# Patient Record
Sex: Male | Born: 1950 | Race: White | Hispanic: No | Marital: Married | Smoking: Never smoker
Health system: Southern US, Community
[De-identification: ages and names within clinical notes are randomized; demographics above are authoritative.]

## PROBLEM LIST (undated history)

## (undated) DIAGNOSIS — K760 Fatty (change of) liver, not elsewhere classified: Secondary | ICD-10-CM

## (undated) DIAGNOSIS — R7303 Prediabetes: Secondary | ICD-10-CM

## (undated) DIAGNOSIS — E782 Mixed hyperlipidemia: Secondary | ICD-10-CM

## (undated) DIAGNOSIS — I10 Essential (primary) hypertension: Secondary | ICD-10-CM

## (undated) DIAGNOSIS — J302 Other seasonal allergic rhinitis: Secondary | ICD-10-CM

## (undated) DIAGNOSIS — M1990 Unspecified osteoarthritis, unspecified site: Secondary | ICD-10-CM

## (undated) DIAGNOSIS — D126 Benign neoplasm of colon, unspecified: Secondary | ICD-10-CM

## (undated) HISTORY — DX: Other seasonal allergic rhinitis: J30.2

## (undated) HISTORY — PX: TOTAL HIP ARTHROPLASTY: SHX124

## (undated) HISTORY — DX: Prediabetes: R73.03

## (undated) HISTORY — DX: Unspecified osteoarthritis, unspecified site: M19.90

## (undated) HISTORY — DX: Essential (primary) hypertension: I10

## (undated) HISTORY — DX: Mixed hyperlipidemia: E78.2

## (undated) HISTORY — DX: Benign neoplasm of colon, unspecified: D12.6

## (undated) HISTORY — PX: KNEE SURGERY: SHX244

## (undated) HISTORY — PX: ROTATOR CUFF REPAIR: SHX139

## (undated) HISTORY — DX: Fatty (change of) liver, not elsewhere classified: K76.0

---

## 2012-03-11 ENCOUNTER — Ambulatory Visit: Payer: BC Managed Care – PPO | Attending: Diagnostic Neuroimaging | Admitting: Physical Therapy

## 2012-03-11 DIAGNOSIS — IMO0001 Reserved for inherently not codable concepts without codable children: Secondary | ICD-10-CM | POA: Insufficient documentation

## 2012-03-11 DIAGNOSIS — M6281 Muscle weakness (generalized): Secondary | ICD-10-CM | POA: Insufficient documentation

## 2012-03-11 DIAGNOSIS — R269 Unspecified abnormalities of gait and mobility: Secondary | ICD-10-CM | POA: Insufficient documentation

## 2012-03-12 ENCOUNTER — Ambulatory Visit: Payer: BC Managed Care – PPO | Admitting: Physical Therapy

## 2012-03-18 ENCOUNTER — Ambulatory Visit: Payer: BC Managed Care – PPO | Admitting: Physical Therapy

## 2012-03-24 ENCOUNTER — Ambulatory Visit: Payer: BC Managed Care – PPO | Admitting: Physical Therapy

## 2012-04-06 ENCOUNTER — Ambulatory Visit: Payer: BC Managed Care – PPO | Attending: Diagnostic Neuroimaging | Admitting: Physical Therapy

## 2012-04-06 DIAGNOSIS — IMO0001 Reserved for inherently not codable concepts without codable children: Secondary | ICD-10-CM | POA: Insufficient documentation

## 2012-04-06 DIAGNOSIS — M6281 Muscle weakness (generalized): Secondary | ICD-10-CM | POA: Insufficient documentation

## 2012-04-06 DIAGNOSIS — R269 Unspecified abnormalities of gait and mobility: Secondary | ICD-10-CM | POA: Insufficient documentation

## 2012-04-20 ENCOUNTER — Ambulatory Visit: Payer: BC Managed Care – PPO | Admitting: Physical Therapy

## 2012-04-28 ENCOUNTER — Ambulatory Visit: Payer: BC Managed Care – PPO | Admitting: Physical Therapy

## 2012-05-05 ENCOUNTER — Ambulatory Visit: Payer: BC Managed Care – PPO | Admitting: Physical Therapy

## 2012-12-31 ENCOUNTER — Encounter: Payer: Self-pay | Admitting: *Deleted

## 2012-12-31 ENCOUNTER — Encounter: Payer: BC Managed Care – PPO | Attending: Family Medicine | Admitting: *Deleted

## 2012-12-31 DIAGNOSIS — Z713 Dietary counseling and surveillance: Secondary | ICD-10-CM | POA: Insufficient documentation

## 2012-12-31 DIAGNOSIS — R7309 Other abnormal glucose: Secondary | ICD-10-CM | POA: Insufficient documentation

## 2012-12-31 NOTE — Patient Instructions (Addendum)
Plan:  Aim for 3 Carb Choices per meal (45 grams) +/- 1 either way  Consider reading food labels for Total Carbohydrate and Fat Grams of foods Continue with your current activity level as tolerated

## 2012-12-31 NOTE — Progress Notes (Signed)
  Medical Nutrition Therapy:  Appt start time: 0830 end time:  0930.  Assessment:  Primary concerns today: self referred for nutrition and weight management..Lives with wife who also purchases and prepared meals. She enjoys gourmet cooking too. 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 Activity includes workout 5-6 days a week including weights at [facility], fast walking on treadmill at home 2-3 miles at 3 miles per hour, cycling cross couutry as able with weather twice a week as able. Would like to add swimming as an additional source of activity especially with knee replacement. Has followed Renaldo Fiddler diet off and on over last 10 years. Would like guidance to better nutrition to allow for weight loss.   MEDICATIONS: see list   DIETARY INTAKE: Usual eating pattern includes 3 meals and 0 snacks per day.  Everyday foods include good variety of all food groups.  Avoided foods include fried foods and sweets.    24-hr recall:  B ( AM): Adkins nutrition bar and 2 coffee with 1/2 and 1/2 or black OR cereal with 2% milk, banana OR eggs, sausage (no starch) Snk ( AM): none  L ( PM): varies: business lunch to college cafe or goes home; tuna with mayo and relish OR chicken, unsweet tea or diet soda infrequently Snk ( PM): none D ( PM): varies a lot: meat, vegetables, added fruit and cheese Or full course meal if at events Snk ( PM): none Beverages: coffee, unsweet tea, diet soda, some water  Usual physical activity: see above  Estimated energy needs: 1500 calories 170 g carbohydrates 112 g protein 42 g fat  Progress Towards Goal(s):  In progress.   Nutritional Diagnosis:  NI-1.5 Excessive energy intake As related to activity level.  As evidenced by BMI of 33.    Intervention:  Nutrition for weight loss initiated. Discussed Carb Counting and reading food labels as method of portion control. Commended him on his current level of activity.  Handouts  given during visit include: Carb Counting and Food Label handouts Meal Plan Card  Monitoring/Evaluation:  Dietary intake, exercise, reading food labels, and body weight prn.

## 2013-01-09 ENCOUNTER — Encounter: Payer: Self-pay | Admitting: *Deleted

## 2013-05-21 ENCOUNTER — Other Ambulatory Visit: Payer: Self-pay | Admitting: Family Medicine

## 2013-05-21 DIAGNOSIS — R7989 Other specified abnormal findings of blood chemistry: Secondary | ICD-10-CM

## 2013-05-27 ENCOUNTER — Ambulatory Visit
Admission: RE | Admit: 2013-05-27 | Discharge: 2013-05-27 | Disposition: A | Discharge status: Home / Self Care | Payer: BC Managed Care – PPO | Source: Ambulatory Visit | Attending: Family Medicine | Admitting: Family Medicine

## 2013-05-27 DIAGNOSIS — R7989 Other specified abnormal findings of blood chemistry: Secondary | ICD-10-CM

## 2013-08-13 ENCOUNTER — Emergency Department (HOSPITAL_COMMUNITY)
Admission: EM | Admit: 2013-08-13 | Discharge: 2013-08-13 | Disposition: A | Discharge status: Home / Self Care | Payer: BC Managed Care – PPO | Attending: Emergency Medicine | Admitting: Emergency Medicine

## 2013-08-13 ENCOUNTER — Encounter (HOSPITAL_COMMUNITY): Payer: Self-pay | Admitting: Emergency Medicine

## 2013-08-13 DIAGNOSIS — T7840XA Allergy, unspecified, initial encounter: Secondary | ICD-10-CM | POA: Insufficient documentation

## 2013-08-13 DIAGNOSIS — R220 Localized swelling, mass and lump, head: Secondary | ICD-10-CM | POA: Insufficient documentation

## 2013-08-13 DIAGNOSIS — L299 Pruritus, unspecified: Secondary | ICD-10-CM | POA: Insufficient documentation

## 2013-08-13 DIAGNOSIS — Y990 Civilian activity done for income or pay: Secondary | ICD-10-CM | POA: Insufficient documentation

## 2013-08-13 DIAGNOSIS — J309 Allergic rhinitis, unspecified: Secondary | ICD-10-CM | POA: Insufficient documentation

## 2013-08-13 DIAGNOSIS — Y929 Unspecified place or not applicable: Secondary | ICD-10-CM | POA: Insufficient documentation

## 2013-08-13 DIAGNOSIS — Z79899 Other long term (current) drug therapy: Secondary | ICD-10-CM | POA: Insufficient documentation

## 2013-08-13 DIAGNOSIS — R221 Localized swelling, mass and lump, neck: Secondary | ICD-10-CM | POA: Insufficient documentation

## 2013-08-13 DIAGNOSIS — T6591XA Toxic effect of unspecified substance, accidental (unintentional), initial encounter: Secondary | ICD-10-CM | POA: Insufficient documentation

## 2013-08-13 DIAGNOSIS — Z880 Allergy status to penicillin: Secondary | ICD-10-CM | POA: Insufficient documentation

## 2013-08-13 DIAGNOSIS — E669 Obesity, unspecified: Secondary | ICD-10-CM | POA: Insufficient documentation

## 2013-08-13 DIAGNOSIS — I10 Essential (primary) hypertension: Secondary | ICD-10-CM | POA: Insufficient documentation

## 2013-08-13 DIAGNOSIS — Z7982 Long term (current) use of aspirin: Secondary | ICD-10-CM | POA: Insufficient documentation

## 2013-08-13 DIAGNOSIS — R0789 Other chest pain: Secondary | ICD-10-CM | POA: Insufficient documentation

## 2013-08-13 DIAGNOSIS — R0602 Shortness of breath: Secondary | ICD-10-CM | POA: Insufficient documentation

## 2013-08-13 MED ORDER — PREDNISONE 20 MG PO TABS
60.0000 mg | ORAL_TABLET | Freq: Once | ORAL | Status: AC
  Administered 2013-08-13: 60 mg via ORAL
  Filled 2013-08-13: qty 3

## 2013-08-13 MED ORDER — FAMOTIDINE 20 MG PO TABS
20.0000 mg | ORAL_TABLET | Freq: Once | ORAL | Status: AC
  Administered 2013-08-13: 20 mg via ORAL
  Filled 2013-08-13: qty 1

## 2013-08-13 MED ORDER — DIPHENHYDRAMINE HCL 25 MG PO CAPS
50.0000 mg | ORAL_CAPSULE | Freq: Once | ORAL | Status: AC
  Administered 2013-08-13: 50 mg via ORAL
  Filled 2013-08-13: qty 2

## 2013-08-13 MED ORDER — PREDNISONE 20 MG PO TABS: ORAL_TABLET | ORAL | Status: DC

## 2013-08-13 MED ORDER — EPINEPHRINE 0.3 MG/0.3ML IJ SOAJ: 0.3000 mg | Freq: Once | INTRAMUSCULAR | Status: DC

## 2013-08-13 NOTE — ED Provider Notes (Signed)
I have personally seen and examined the patient.  I have discussed the plan of care with the resident.  I have reviewed the documentation on PMH/FH/Soc. History.  I have reviewed the documentation of the resident and agree.  I have reviewed and agree with the ECG interpretation(s) documented by the resident.   Joya Gaskins, MD 08/13/13 [phone]

## 2013-08-13 NOTE — ED Provider Notes (Signed)
CSN: [id number]     Arrival date & time 08/13/13  1636 History   First MD Initiated Contact with Patient 08/13/13 1651     Chief Complaint  Patient presents with  . Allergic Reaction   (Consider location/radiation/quality/duration/timing/severity/associated sxs/prior Treatment) HPI [name] is a 62 y.o. y/o male w/ PMHx of HTN, presents to the ED w/ complaints of recent itching, facial swelling, and chest tightness. The patient claims he was at work and started to feel itching on the top of his head w/ an accompanying bump, then his right lower jaw. He then had swelling of his left eye w/ redness to the point that his co-workers began to notice. He also had swelling of his lips and pruritis on his abdomen. The patient denies eating any new or different foods, no recent changes in detergents, soaps, insect bites, or plant exposure. He does have a history of severe hay fever as a child and has carried an epi-pen in the past for wasp bites.    Past Medical History  Diagnosis Date  . Hypertension   . Obesity    Past Surgical History  Procedure Laterality Date  . Joint replacement     History reviewed. No pertinent family history. History  Substance Use Topics  . Smoking status: Never Smoker   . Smokeless tobacco: Never Used  . Alcohol Use: Yes     Comment: wine with dinner 4-5 nights a week    Review of Systems General: Denies fever, chills, diaphoresis, appetite change and fatigue.  HEENT: Pruritis of head, right jaw, swelling of left eye, and swelling of lips. Respiratory: Positive for mild SOB, chest tightness. Denies cough and wheezing.   Cardiovascular: Denies chest pain, palpitations and leg swelling.  Gastrointestinal: Denies nausea, vomiting, abdominal pain, diarrhea, constipation, blood in stool and abdominal distention.  Genitourinary: Denies dysuria, urgency, frequency, hematuria, flank pain and difficulty urinating.  Endocrine: Denies hot or cold intolerance,  sweats, polyuria, polydipsia. Musculoskeletal: Denies myalgias, back pain, joint swelling, arthralgias and gait problem.  Skin: Denies pallor, rash and wounds.  Neurological: Denies dizziness, seizures, syncope, weakness, lightheadedness, numbness and headaches.  Psychiatric/Behavioral: Denies mood changes, confusion, nervousness, sleep disturbance and agitation.  Allergies  Penicillins  Home Medications   Current Outpatient Rx  Name  Route  Sig  Dispense  Refill  . aspirin 81 MG tablet   Oral   Take 81 mg by mouth daily.         . fish oil-omega-3 fatty acids 1000 MG capsule   Oral   Take 2 g by mouth daily.         . Ginkgo Biloba 40 MG TABS   Oral   Take by mouth.         . metoprolol succinate (TOPROL-XL) 100 MG 24 hr tablet   Oral   Take 100 mg by mouth daily. Take with or immediately following a meal.         . Multiple Vitamin (MULTIVITAMIN) tablet   Oral   Take 1 tablet by mouth daily.         . naproxen sodium (ANAPROX) 220 MG tablet   Oral   Take 220 mg by mouth 2 (two) times daily with a meal.         . triamterene-hydrochlorothiazide (DYAZIDE) 37.5-25 MG per capsule   Oral   Take 1 capsule by mouth every morning.          Physical Exam Filed Vitals:   08/13/13 1647  BP: 156/93  Pulse: 87  Temp: 98.3 F (36.8 C)  TempSrc: Oral  Resp: 18  SpO2: 100%   General: Vital signs reviewed.  Patient is a well-developed and well-nourished, in no acute distress and cooperative with exam. Alert and oriented x3.  Head: Normocephalic and atraumatic. No obvious urticaria or erythema Eyes: PERRL, EOMI, conjunctivae normal, No scleral icterus. Left eye swelling. Neck: Supple, trachea midline, normal ROM, No JVD, masses, thyromegaly, or carotid bruit present.  Cardiovascular: RRR, S1 normal, S2 normal, no murmurs, gallops, or rubs. Pulmonary/Chest: Normal respiratory effort, CTAB, no wheezes, rales, or rhonchi. Abdominal: Soft, non-tender,  non-distended, bowel sounds are normal, no masses, organomegaly, or guarding present. Small patch of erythema inferior to umbilicus. Musculoskeletal: No joint deformities, erythema, or stiffness, ROM full and no nontender. Extremities: No swelling or edema,  pulses symmetric and intact bilaterally. No cyanosis or clubbing. Neurological: A&O x3, Strength is normal and symmetric bilaterally, cranial nerve II-XII are grossly intact, no focal motor deficit, sensory intact to light touch bilaterally.  Skin: Warm, dry and intact. No rashes or erythema. Psychiatric: Normal mood and affect. speech and behavior is normal. Cognition and memory are normal.   ED Course  Procedures (including critical care time) Labs Review Labs Reviewed - No data to display Imaging Review No results found.  EKG Interpretation    Date/Time:  Friday August 13 2013 16:40:53 EST Ventricular Rate:  88 PR Interval:  150 QRS Duration: 98 QT Interval:  388 QTC Calculation: 469 R Axis:   -4 Text Interpretation:  Normal sinus rhythm Normal ECG Confirmed by Bebe Shaggy  MD, DONALD [phone]) on 08/13/2013 4:52:43 PM            MDM   [name] is a 62 y.o. y/o male w/ PMHx of HTN, presents to the ED w/ complaints of recent itching, facial swelling, and chest tightness, likely 2/2 allergic reaction. Given clinical presentation, do not feel Epinephrine is necessary at this time.  -Will observe for now. -Benadryl 50 mg po -Pepcid 20 mg po -Prednisone 60 mg po  Patient much improved, still with mild pruritis, lip swelling improved according to the patient. No further intervention necessary at this time, patient is stable for discharge. Will give 3 more days of Prednisone 60 mg po qd + Rx for Epi-pen. Will instruct to take Benadryl OTC.  Courtney Paris, MD 08/13/13 [phone]

## 2013-08-13 NOTE — ED Provider Notes (Signed)
Patient seen/examined in the Emergency Department in conjunction with Resident Physician Provider Jones Patient reports allergic rxn Exam : mild swelling to left eye.  No lip/tongue swelling noted.  Uvula midline without erythema/edema Plan: treat for allergic rxn and reassess Would offer epipen as Rx at discharge   Joya Gaskins, MD 08/13/13 1750

## 2013-08-13 NOTE — ED Notes (Signed)
Pt c/o allergic reaction today from unknown cause with itching to head with bumps, left eye swelling and lip swelling; pt sts some tightness in chest; no distress noted

## 2013-08-13 NOTE — ED Notes (Signed)
EKG completed in triage.

## 2014-03-11 ENCOUNTER — Encounter: Payer: Self-pay | Admitting: *Deleted

## 2014-10-09 IMAGING — US US ABDOMEN COMPLETE
1 series · 14 of 25 positions shown · non-contrast
Comparison: None.

CLINICAL DATA: Elevated LFTs

EXAM:
ABDOMEN ULTRASOUND

[Series 1: us abdomen complete · 0.49mm/px · 14 of 90 slices shown]
[im 1/90]
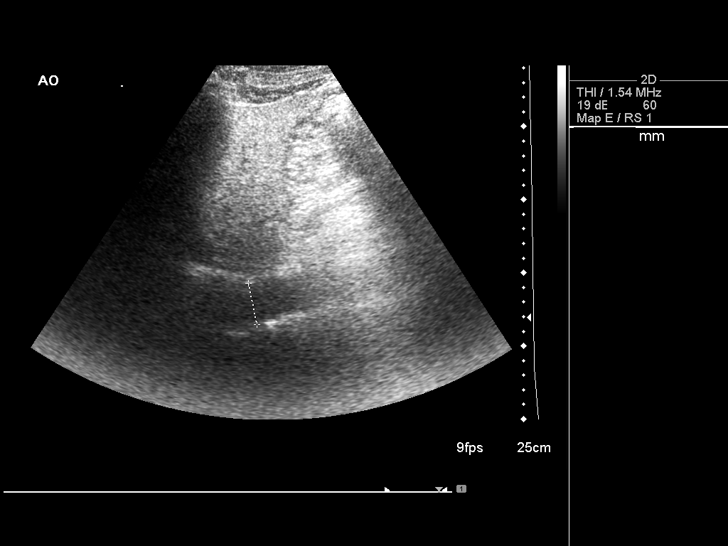
[im 8/90]
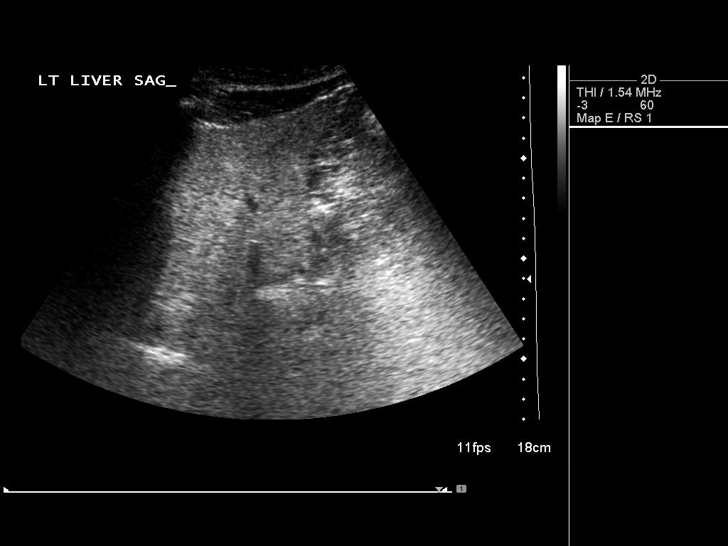
[im 15/90]
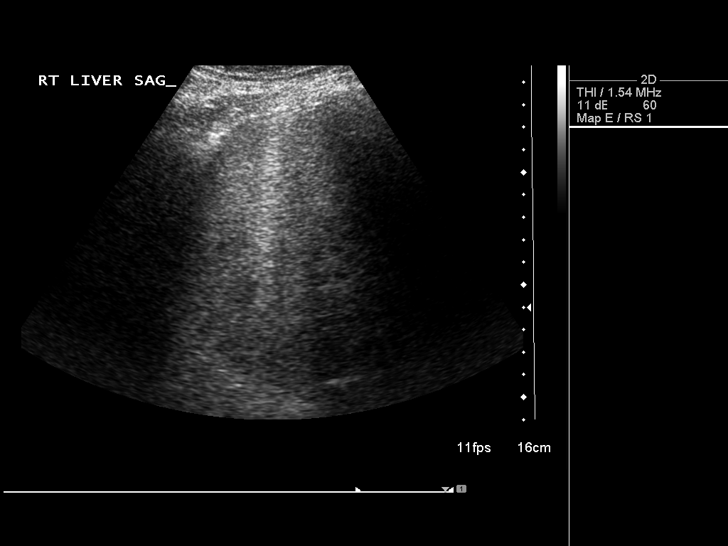
[im 23/90]
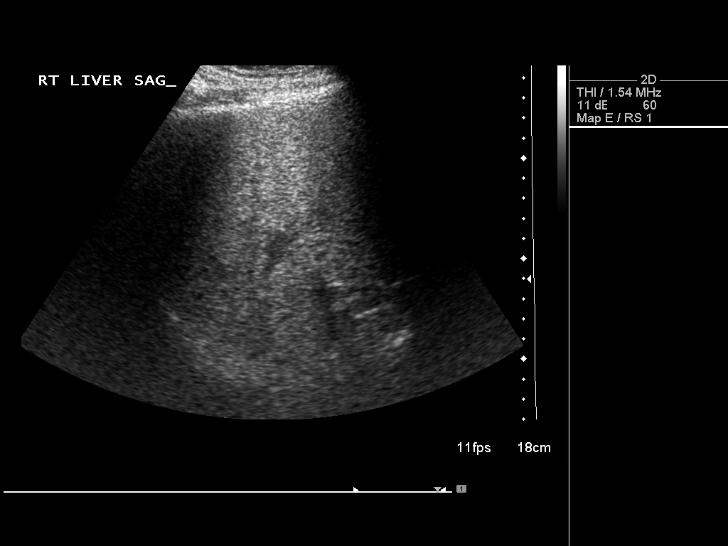
[im 30/90]
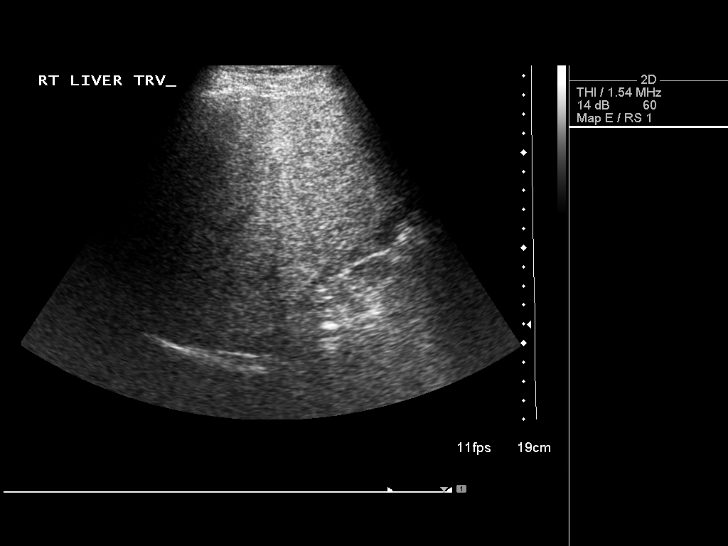
[im 34/90]
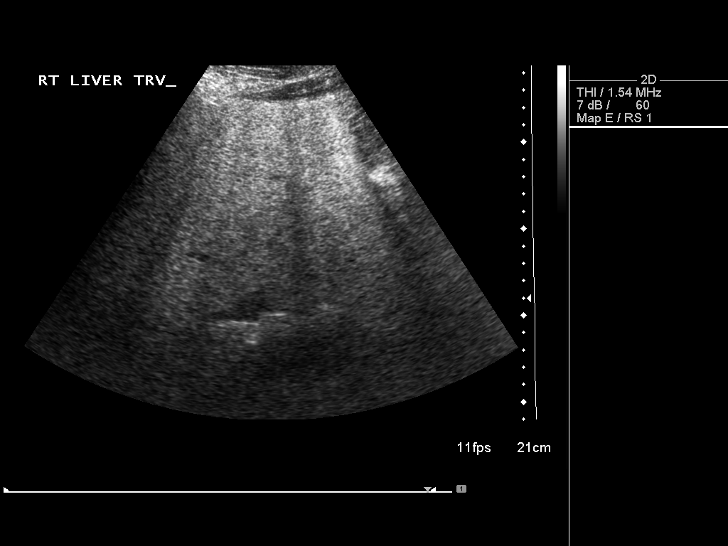
[im 41/90]
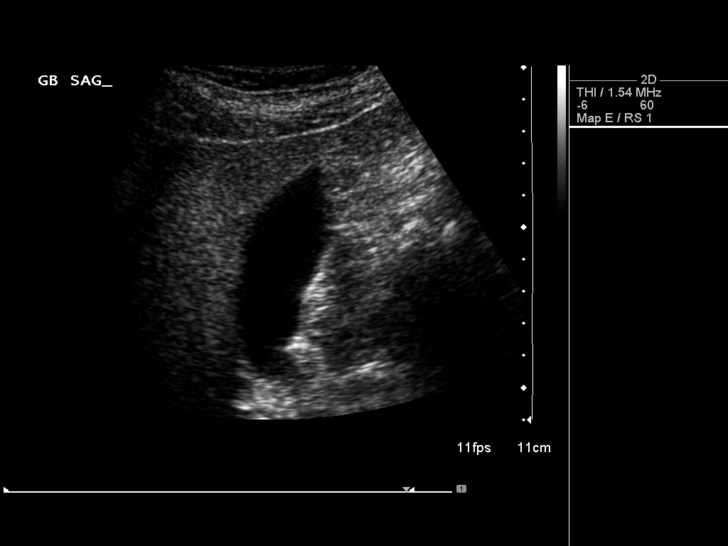
[im 49/90]
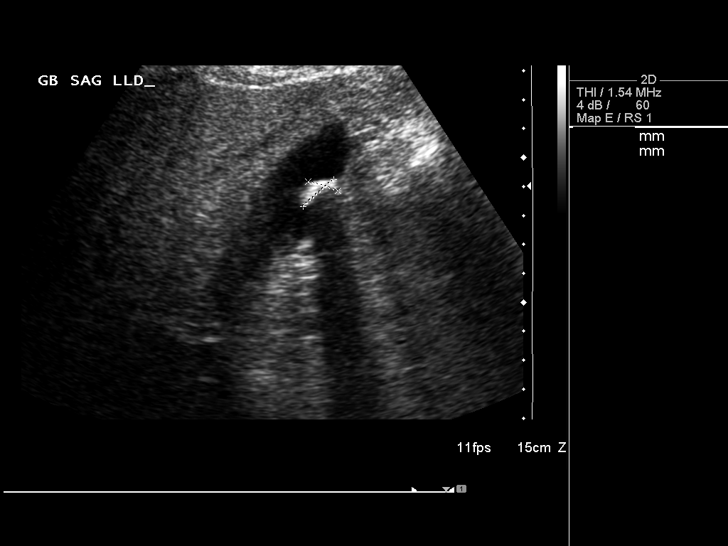
[im 56/90]
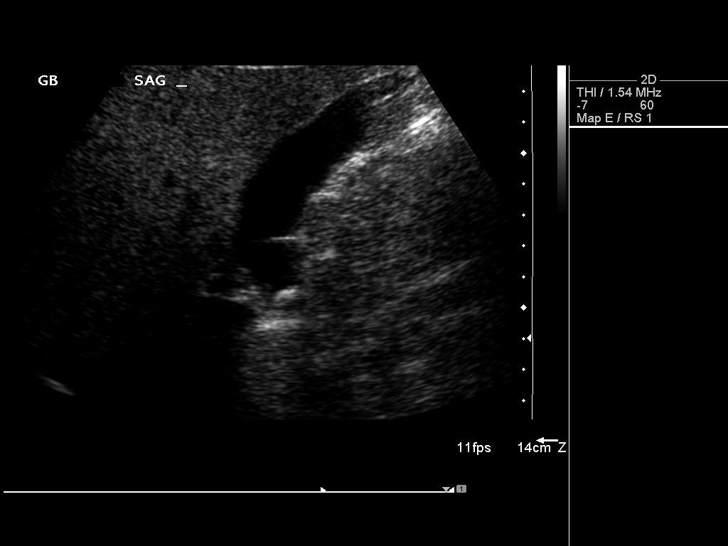
[im 60/90]
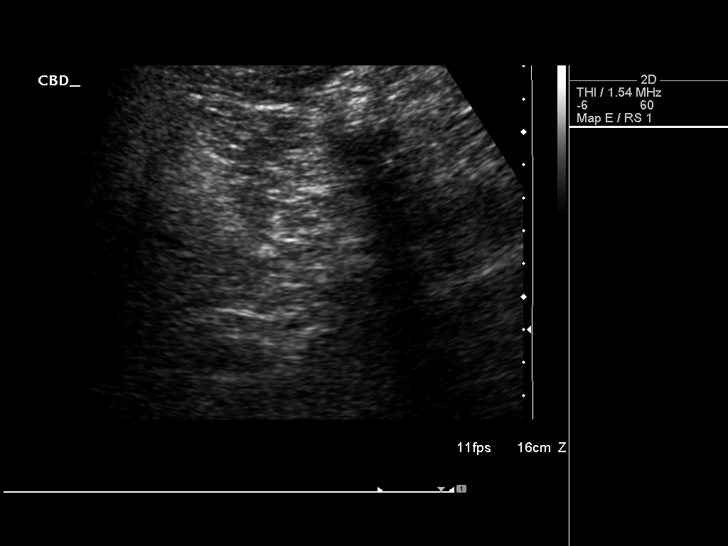
[im 67/90]
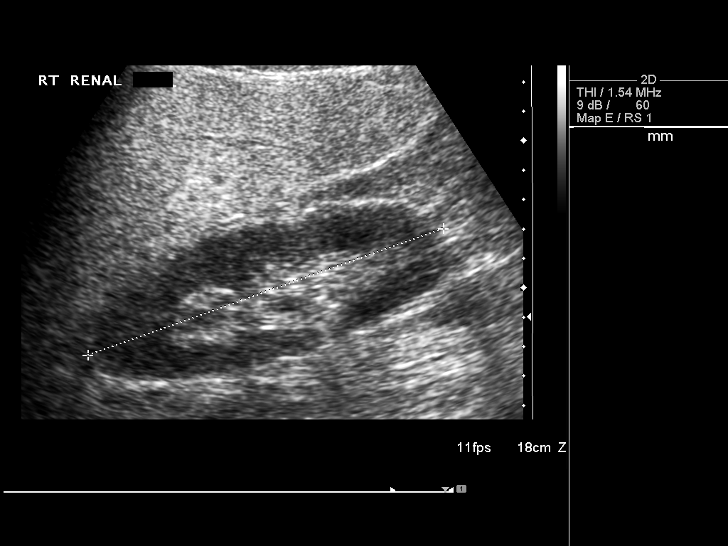
[im 75/90]
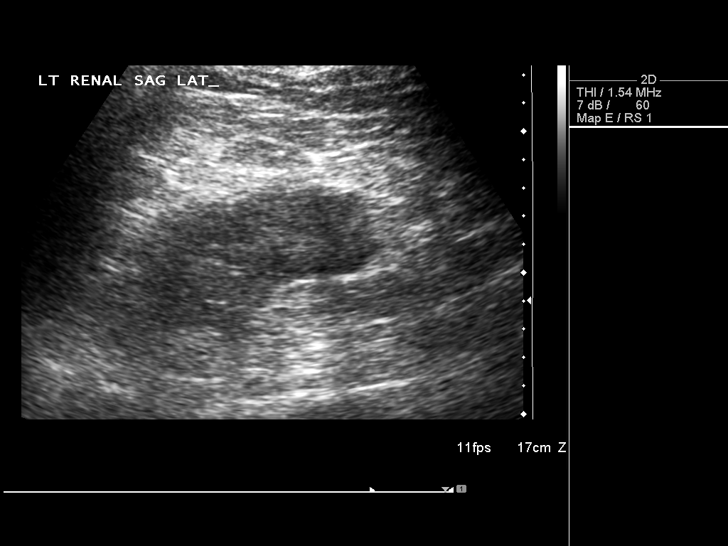
[im 82/90]
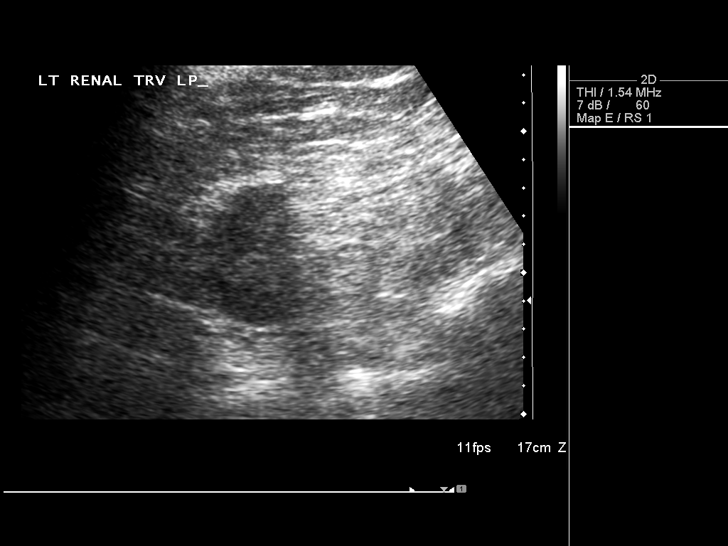
[im 90/90]
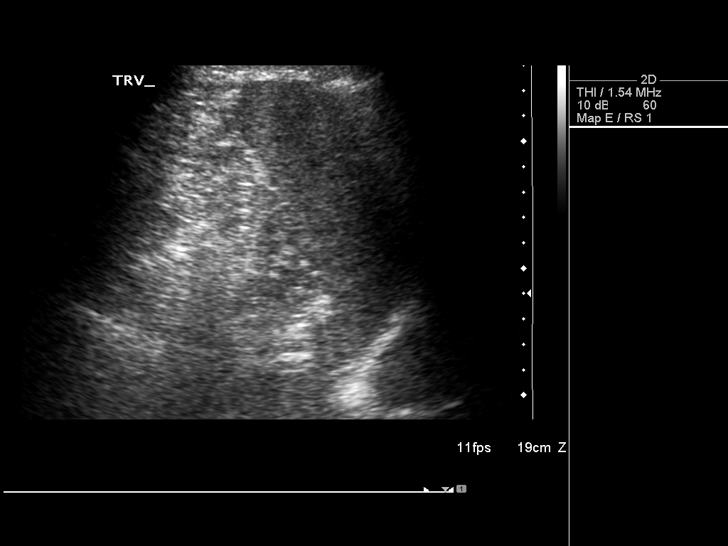

[14 of 25 positions shown; findings below may reference images not displayed]

FINDINGS: Gallbladder

14 mm mobile gallstone. No gallbladder wall thickening or
pericholecystic fluid. Negative sonographic Murphy's sign.

Common bile duct

Diameter: 5 mm.

Liver

No focal lesion identified. Hyperechoic hepatic parenchyma,
reflecting hepatic steatosis.

IVC

Poorly visualized.

Pancreas

Poorly visualized due to overlying bowel gas.

Spleen

Measures 6.9 cm.

Right Kidney

Length: 14.3 cm. Echogenicity within normal limits. No mass or
hydronephrosis visualized.

Left Kidney

Length: 12.9 cm. Echogenicity within normal limits. No mass or
hydronephrosis visualized.

Abdominal aorta

No aneurysm visualized.
IMPRESSION: Cholelithiasis, without associated sonographic findings to suggest
acute cholecystitis.

Hepatic steatosis.

## 2015-12-05 DIAGNOSIS — M25561 Pain in right knee: Secondary | ICD-10-CM | POA: Diagnosis not present

## 2015-12-05 DIAGNOSIS — Z23 Encounter for immunization: Secondary | ICD-10-CM | POA: Diagnosis not present

## 2015-12-13 DIAGNOSIS — M25561 Pain in right knee: Secondary | ICD-10-CM | POA: Diagnosis not present

## 2015-12-21 DIAGNOSIS — M25561 Pain in right knee: Secondary | ICD-10-CM | POA: Diagnosis not present

## 2016-01-02 DIAGNOSIS — M25561 Pain in right knee: Secondary | ICD-10-CM | POA: Diagnosis not present

## 2016-02-07 DIAGNOSIS — M25561 Pain in right knee: Secondary | ICD-10-CM | POA: Diagnosis not present

## 2016-02-12 DIAGNOSIS — K760 Fatty (change of) liver, not elsewhere classified: Secondary | ICD-10-CM | POA: Diagnosis not present

## 2016-02-12 DIAGNOSIS — I10 Essential (primary) hypertension: Secondary | ICD-10-CM | POA: Diagnosis not present

## 2016-02-12 DIAGNOSIS — E7800 Pure hypercholesterolemia, unspecified: Secondary | ICD-10-CM | POA: Diagnosis not present

## 2016-02-12 DIAGNOSIS — R7303 Prediabetes: Secondary | ICD-10-CM | POA: Diagnosis not present

## 2016-02-13 ENCOUNTER — Ambulatory Visit (INDEPENDENT_AMBULATORY_CARE_PROVIDER_SITE_OTHER): Payer: BLUE CROSS/BLUE SHIELD | Admitting: Diagnostic Neuroimaging

## 2016-02-13 ENCOUNTER — Encounter: Payer: Self-pay | Admitting: Diagnostic Neuroimaging

## 2016-02-13 VITALS — BP 133/84 | HR 86 | Ht 66.0 in | Wt 186.0 lb

## 2016-02-13 DIAGNOSIS — G5631 Lesion of radial nerve, right upper limb: Secondary | ICD-10-CM | POA: Diagnosis not present

## 2016-02-13 NOTE — Progress Notes (Signed)
[facility]  PATIENT: Shawn Ruiz DOB: [birth year]  REFERRING CLINICIAN: Ehinger HISTORY FROM: patient  REASON FOR VISIT: new consult / existing patient    HISTORICAL  CHIEF COMPLAINT:  Chief Complaint  Patient presents with  . 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 He was lifting and carrying, chopping down brush, as well as moving boxes at home. After 4 days of heavy exertion he noticed weakness in his right grip strength in right arm weakness. Numbness in right hand was noted. He was developing right wrist drop. 12 pulling open a door his finger slipped and he banged his right elbow against a hard object. In retrospect 15 years ago patient had an episode of left hand weakness associated with heavy exertion. No family history of similar problems. Patient has been using a brace on his right hand and elbow. Symptoms are slightly improving.  PRIOR HPI (02/27/12): 65 year old male with HTN, hyperchol, with left foot drop. 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 Felt tired in PM, fell asleep at table, but woke up and slept rest of night in bed. No trauma. June 22 - woke up with left foot weakness; went to local hospital and had workup. dx'd with left foot drop. Since then, slight improvement in symptoms.still with numbness and weakness. no problems in right foot, hands, or neck. no HA. no feveres, chills, bowel/bladder problems. no back pain. 4-5 days prior to symptom onset, discovered "bug bite" mark on left knee. No tick found. Treated with doxycyclinge. Serum lyme test  negative.   REVIEW OF SYSTEMS: Full 14 system review of systems performed and negative with exception of: Numbness weakness joint swelling neck pain.   ALLERGIES: Allergies  Allergen Reactions  . Penicillins     Childhood, reaction unknown    HOME MEDICATIONS: Outpatient Prescriptions Prior to Visit  Medication Sig Dispense Refill  . Multiple Vitamin (MULTIVITAMIN) tablet Take 1 tablet by mouth daily.    Marland Kitchen triamterene-hydrochlorothiazide (DYAZIDE) 37.5-25 MG per capsule Take 1 capsule by mouth every morning.    Marland Kitchen EPINEPHrine (EPI-PEN) 0.3 mg/0.3 mL SOAJ injection Inject 0.3 mLs (0.3 mg total) into the muscle once. (Patient not taking: Reported on 02/13/2016) 2 Device 0  . aspirin 81 MG tablet Take 81 mg by mouth daily.    . fish oil-omega-3 fatty acids 1000 MG capsule Take 2 g by mouth daily.    . Ginkgo Biloba 40 MG TABS Take 1 tablet by mouth daily.     . metoprolol succinate (TOPROL-XL) 100 MG 24 hr tablet Take 100 mg by mouth daily. Take with or immediately following a meal.    . naproxen sodium (ANAPROX) 220 MG tablet Take 220 mg by mouth 2 (two) times daily with a meal.    . predniSONE (DELTASONE) 20 MG tablet Please take 60 mg (3 tablets) daily for 3 more days. 9 tablet 0   No facility-administered medications prior to visit.    PAST MEDICAL HISTORY: Past Medical History  Diagnosis Date  . Essential hypertension, benign   .  Mixed hyperlipidemia   . Prediabetes   . OA (osteoarthritis)   . Seasonal allergies   . Adenomatous colon polyp   . Fatty liver     PAST SURGICAL HISTORY: Past Surgical History  Procedure Laterality Date  . Total hip arthroplasty Right   . Rotator cuff repair Right   . Knee surgery Left     torn meniscus    FAMILY HISTORY: Family History  Problem Relation Age of Onset  . CAD Father   . Ulcers Mother   . Hypertension Mother   . CAD Mother   . Congestive Heart Failure Mother     SOCIAL HISTORY:  Social History   Social History    . Marital Status: Married    Spouse Name: carolyn  . Number of Children: 4  . Years of Education: PhD   Occupational History  .      [employer]   Social History Main Topics  . Smoking status: Never Smoker   . Smokeless tobacco: Never Used  . Alcohol Use: Yes     Comment: wine with dinner 4-5 nights a week  . Drug Use: No  . Sexual Activity: Not on file   Other Topics Concern  . Not on file   Social History Narrative   Lives with wife     PHYSICAL EXAM  GENERAL EXAM/CONSTITUTIONAL: Vitals:  Filed Vitals:   02/13/16 1118  BP: 133/84  Pulse: 86  Height: 5\' 6"  (1.676 m)  Weight: 186 lb (84.369 kg)     Body mass index is 30.04 kg/(m^2).  Visual Acuity Screening   Right eye Left eye Both eyes  Without correction:     With correction: 20/20 20/20      Patient is in no distress; well developed, nourished and groomed; neck is supple  CARDIOVASCULAR:  Examination of carotid arteries is normal; no carotid bruits  Regular rate and rhythm, no murmurs  Examination of peripheral vascular system by observation and palpation is normal  EYES:  Ophthalmoscopic exam of optic discs and posterior segments is normal; no papilledema or hemorrhages  MUSCULOSKELETAL:  Gait, strength, tone, movements noted in Neurologic exam below  NEUROLOGIC: MENTAL STATUS:  No flowsheet data found.  awake, alert, oriented to person, place and time  recent and remote memory intact  normal attention and concentration  language fluent, comprehension intact, naming intact,   fund of knowledge appropriate  CRANIAL NERVE:   2nd - no papilledema on fundoscopic exam  2nd, 3rd, 4th, 6th - pupils equal and reactive to light, visual fields full to confrontation, extraocular muscles intact, no nystagmus  5th - facial sensation symmetric  7th - facial strength symmetric  8th - hearing intact  9th - palate elevates symmetrically, uvula midline  11th - shoulder shrug  symmetric  12th - tongue protrusion midline  MOTOR:   normal bulk and tone, full strength in the LUE, BLE  RUE (TRICEPS 3, BRACHIORAD 4, SUPINATOR 3, WRIST EXT 2-3, FINGER EXT 2; GRIP 4; FINGER ABDUCTION 4)  SENSORY:   normal and symmetric to light touch, pinprick, temperature, vibration; EXCEPT DECR IN RIGHT FOREARM AND HAND IN RADIAL NERVE DISTRIBUTION  COORDINATION:   finger-nose-finger, fine finger movements normal  REFLEXES:   deep tendon reflexes present and symmetric  GAIT/STATION:   narrow based gait; able to walk on toes, heels and tandem; romberg is negative    DIAGNOSTIC DATA (LABS, IMAGING, TESTING) - I reviewed patient records, labs, notes, testing and imaging myself where available.  No results found for: WBC, HGB, HCT, MCV, PLT No results found for: NA, K, CL, CO2, GLUCOSE, BUN, CREATININE, CALCIUM, PROT, ALBUMIN, AST, ALT, ALKPHOS, BILITOT, GFRNONAA, GFRAA No results found for: CHOL, HDL, LDLCALC, LDLDIRECT, TRIG, CHOLHDL No results found for: HGBA1C No results found for: VITAMINB12 No results found for: TSH   03/11/12 EMG/NCS  1. 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 2. No electrodiagnostic evidence of left L5 radiculopathy or underlying polyneuropathy.    ASSESSMENT AND PLAN  65 y.o. year old male here with new onset right upper extremity weakness in setting of heavy exertion. Symptoms localized mainly to right radial neuropathy. Interestingly patient had similar episode 4 years ago with left foot drop in setting of heavy exertion. May have had a similar episode affecting left hand 15 years ago. Therefore patient may have some predisposition for compression neuropathy such as the genetic condition hereditary neuropathy with liability for pressure palsies (HNPP).  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 Compression neuropathy vs  HNPP  1. Radial neuropathy, right      PLAN: - symptoms are spontenously improving, therefore will refer for occupational therapy - avoid pressure compression - if symptoms do not improve, then may consider EMG/NCS and MRI cervical spine  Orders Placed This Encounter  Procedures  . Ambulatory referral to Occupational Therapy   Return in about 2 months (around 04/14/2016).    Penni Bombard, MD [id number], [id number] AM Certified in Neurology, Neurophysiology and Neuroimaging  [facility] [address], [place], [place] [address] [phone]

## 2016-02-13 NOTE — Patient Instructions (Signed)
Thank you for coming to see [place] at [facility]. I hope we have been able to provide you high quality care today.  You may receive a patient satisfaction survey over the next few weeks. We would appreciate your feedback and comments so that we may continue to improve ourselves and the health of our patients.  - I will setup occupational therapy.  - Avoid excessive pressure / exertion on joints / limbs.   ~~~~~~~~~~~~~~~~~~~~~~~~~~~~~~~~~~~~~~~~~~~~~~~~~~~~~~~~~~~~~~~~~  DR. Holdan Stucke'S GUIDE TO HAPPY AND HEALTHY LIVING These are some of my general health and wellness recommendations. Some of them may apply to you better than others. Please use common sense as you try these suggestions and feel free to ask me any questions.   ACTIVITY/FITNESS Mental, social, emotional and physical stimulation are very important for brain and body health. Try learning a new activity (arts, music, language, sports, games).  Keep moving your body to the best of your abilities. You can do this at home, inside or outside, the park, community center, gym or anywhere you like. Consider a physical therapist or personal trainer to get started. Consider the app Sworkit. Fitness trackers such as smart-watches, smart-phones or Fitbits can help as well.   NUTRITION Eat more plants: colorful vegetables, nuts, seeds and berries.  Eat less sugar, salt, preservatives and processed foods.  Avoid toxins such as cigarettes and alcohol.  Drink water when you are thirsty. Warm water with a slice of lemon is an excellent morning drink to start the day.  Consider these websites for more information The Nutrition Source (https://www.henry-hernandez.biz/) Precision Nutrition (WindowBlog.ch)   RELAXATION Consider practicing mindfulness meditation or other relaxation techniques such as deep breathing, prayer, yoga, tai chi, massage. See website mindful.org or the  apps Headspace or Calm to help get started.   SLEEP Try to get at least 7-8+ hours sleep per day. Regular exercise and reduced caffeine will help you sleep better. Practice good sleep hygeine techniques. See website sleep.org for more information.   PLANNING Prepare estate planning, living will, healthcare POA documents. Sometimes this is best planned with the help of an attorney. Theconversationproject.org and agingwithdignity.org are excellent resources.

## 2016-02-15 ENCOUNTER — Ambulatory Visit: Payer: BLUE CROSS/BLUE SHIELD | Attending: Diagnostic Neuroimaging | Admitting: Occupational Therapy

## 2016-02-15 ENCOUNTER — Encounter: Payer: Self-pay | Admitting: Occupational Therapy

## 2016-02-15 DIAGNOSIS — M25631 Stiffness of right wrist, not elsewhere classified: Secondary | ICD-10-CM | POA: Insufficient documentation

## 2016-02-15 DIAGNOSIS — M6281 Muscle weakness (generalized): Secondary | ICD-10-CM | POA: Insufficient documentation

## 2016-02-15 DIAGNOSIS — R208 Other disturbances of skin sensation: Secondary | ICD-10-CM | POA: Diagnosis not present

## 2016-02-15 DIAGNOSIS — M25521 Pain in right elbow: Secondary | ICD-10-CM | POA: Diagnosis not present

## 2016-02-15 NOTE — Therapy (Signed)
[facility] [address] [place], [place], [address] Phone: [phone]   Fax:  [phone]  Occupational Therapy Evaluation  Patient Details  Name: Shawn Ruiz MRN: [id number] Date of Birth: Jul 20, 1951 Referring Provider: Dr. Curt Bears  Encounter Date: 02/15/2016      OT End of Session - 02/15/16 1517    Visit Number 1   Number of Visits 16   Date for OT Re-Evaluation 04/11/16   Authorization Type BCBS   Authorization Time Period awaiting info regarding possible insurance limitations   OT Start Time 1401   OT Stop Time 1442   OT Time Calculation (min) 41 min   Activity Tolerance Patient tolerated treatment well      Past Medical History  Diagnosis Date  . Essential hypertension, benign   . Mixed hyperlipidemia   . Prediabetes   . OA (osteoarthritis)   . Seasonal allergies   . Adenomatous colon polyp   . Fatty liver     Past Surgical History  Procedure Laterality Date  . Total hip arthroplasty Right   . Rotator cuff repair Right   . Knee surgery Left     torn meniscus    There were no vitals filed for this visit.      Subjective Assessment - 02/15/16 1407    Subjective  This has happened before to me - I guess I need to watch how much I am doing.   Pertinent History see epic snapshot.    Patient Stated Goals I want to regain full function in my right hand.    Currently in Pain? Yes   Pain Score 4    Pain Location Elbow   Pain Orientation Right   Pain Descriptors / Indicators Constant;Aching;Dull   Pain Type Acute pain   Pain Onset 1 to 4 weeks ago   Aggravating Factors  If I sit with elbow resting on something or work above my head   Pain Relieving Factors avoiding above, lidocaine rub. Pt banged eblow very hard on marble counte open a drawer. Reports it is improving every day           [facility] OT Assessment - 02/15/16 0001    Assessment   Diagnosis R radial neuropathy   Referring  Provider Dr. Curt Bears   Onset Date 02/02/16   Prior Therapy No prior therapy for this current episode   Precautions   Precautions Other (comment)   Required Braces or Orthoses Other Brace/Splint   Other Brace/Splint elbow pad and wrist splint. 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   ADL   Eating/Feeding Modified independent  using L non dominant hand   Grooming Modified independent  Upper Body Bathing Modified independent   Lower Body Bathing Modified independent   Upper Body Dressing Minimal assistance  for top button and to put on tie   Lower Body Dressing Modified independent   Toilet Tranfer Modified independent   Toileting - Clothing Manipulation Modified independent   [place] Transfer Independent   ADL comments Pt using L non dominant hand as dominant - only able to use R hand in very limited fashion for some activities.   IADL   Shopping Takes care of all shopping needs independently   [place] alone or with occasional assistance   Meal Prep --  pt's wife did all cooking  before   Medication Management Is responsible for taking medication in correct dosages at correct time   modified how he is opening containers   Financial Management Manages financial matters independently (budgets, writes checks, pays rent, bills goes to bank), collects and keeps track of income   Mobility   Mobility Status Independent   Written Expression   Dominant Hand Right   Handwriting 50% legible  pt reports he can write slightly better with wrist brace   Vision - History   Baseline Vision Wears glasses all the time   Vision Assessment   Eye Alignment Within Functional Limits   Comment No visual deficits   Activity Tolerance   Activity Tolerance Endurance does not limit participation in activity   Cognition   Overall Cognitive Status Within Functional Limits for tasks assessed   Sensation   Light Touch Impaired by gross assessment  dorsal aspect of thumb, index and middle finger   Hot/Cold Impaired by gross assessment  dorsal aspect of thumb, index and middle finger   Additional Comments Pt reports slow improvement in numbness, very mild pins and needles.   Coordination   Gross Motor Movements are Fluid and Coordinated Yes  except wrist and hand   Fine Motor Movements are Fluid and Coordinated No   Other Did not test coordination in L hand as pt with inabilty to stablize at wrist to perform tests.  Appears to have normal coordination once all other deficts are accounted for and sensory changes are on dorsal aspect only.    Edema   Edema mild swelling in fingers of R hand   Tone   Assessment Location Right Upper Extremity   ROM / Strength   AROM / PROM / Strength AROM;Strength   AROM   Overall AROM  Deficits   Overall AROM Comments RUE: limited in elbow flexion by 15* relative to L elbow due to pain from hitting it.  R hand also limited as follows: wrist flexion 0-45* with tremor at end range, wrist extension - 15*, no active extension in MCPs and only trace abduction in thunmb in gravity eliminated position.     Strength   Overall Strength Deficits   Overall Strength  Comments see grip strength below   Hand Function   Right Hand Gross Grasp Impaired   Right Hand Grip (lbs) 45 with and without wrist brace   Left Hand Gross Grasp Functional   Left Hand Grip (lbs) 100                           OT Short Term Goals - 02/15/16 1505    OT SHORT TERM GOAL #1   Title Pt will be mod I i with HEP - 03/14/2016  Status New   OT SHORT TERM GOAL #2   Title Pt will be mod i with wear and care of splint - 03/14/2016   Status New   OT SHORT TERM GOAL #3   Title Pt will demonstrate increased wrist extension to 0* (baseline- -15*) to assist wtih functional use of hand.   Status New   OT SHORT TERM GOAL #4   Title Pt will be able to button top button and don tie with splint/brace prn   Status New           OT Long Term Goals - 02/15/16 1508    OT LONG TERM GOAL #1   Title Pt will be mod I with upgraded HEP - 04/11/2016   Status New   OT LONG TERM GOAL #2   Title Pt will demonstrate wirst extension to 5* to assist wtih hand function   Status New   OT LONG TERM GOAL #3   Title Pt will demonstrate partial  MCP extension with wrist stablized to increase functional use of R hand   Status New   OT LONG TERM GOAL #4   Title Pt will be able to write name legibly with R hand AE prn   Status New   OT LONG TERM GOAL #5   Title Pt will be be able to self feed with R hand for at least 50% of the meal   Status New               Plan - 02/15/16 1510    Clinical Impression Statement Pt is a 65 year old male s/p 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 Pt has had two other episodes (LUE 15 years ago and R foot drop 4 years ago).  Pt also with very painful elbow from hitting it very hard on marrble counter 2 weeks after developing neuroopathy.  PMH: not otherwise signficant.  Pt presents today with the following impairments  that impact his ability to complete basic ADL's, IADL's, work responsibilities and leisure activities:  decreased AROM and strength of R dominant hand, edema R hand, impaired sensation, decreased functioal use of R dominant hand, pain,  Pt will beneift from skilled OT to addres these deficits to maximize independence in these activities and life roles. Pt in agreement.   Rehab Potential Good   OT Frequency 2x / week   OT Duration 8 weeks   OT Treatment/Interventions Self-care/ADL training;Electrical Stimulation;Therapeutic exercise;Neuromuscular education;DME and/or AE instruction;Passive range of motion;Manual Therapy;Splinting;Therapeutic activities;Patient/family education   Plan fabricate custom splint prn, intiate HEP if time   Consulted and Agree with Plan of Care Patient      Patient will benefit from skilled therapeutic intervention in order to improve the following deficits and impairments:  Decreased range of motion, Decreased knowledge of use of DME, Decreased strength, Increased edema, Impaired UE functional use, Impaired sensation, Pain  Visit Diagnosis: Muscle weakness (generalized) - Plan: Ot plan of care cert/re-cert  Stiffness of right wrist, not elsewhere classified - Plan: Ot plan of care cert/re-cert  Other disturbances of skin sensation - Plan: Ot plan of care cert/re-cert  Pain in right elbow - Plan: Ot plan of care cert/re-cert    Problem List There are no active problems to display for this patient.   Quay Burow, OTR/L 02/15/2016, 3:28 PM  [facility] [phone]  [place], [place], [address] Phone: [phone]   Fax:  [phone]  Name: Shawn Ruiz MRN: [id number] Date of Birth: August 18, 1951

## 2016-02-20 ENCOUNTER — Ambulatory Visit: Payer: BLUE CROSS/BLUE SHIELD | Admitting: Occupational Therapy

## 2016-02-20 ENCOUNTER — Encounter: Payer: Self-pay | Admitting: Occupational Therapy

## 2016-02-20 DIAGNOSIS — M25631 Stiffness of right wrist, not elsewhere classified: Secondary | ICD-10-CM | POA: Diagnosis not present

## 2016-02-20 DIAGNOSIS — R208 Other disturbances of skin sensation: Secondary | ICD-10-CM | POA: Diagnosis not present

## 2016-02-20 DIAGNOSIS — M25521 Pain in right elbow: Secondary | ICD-10-CM | POA: Diagnosis not present

## 2016-02-20 DIAGNOSIS — M6281 Muscle weakness (generalized): Secondary | ICD-10-CM | POA: Diagnosis not present

## 2016-02-20 NOTE — Patient Instructions (Signed)
SPLINT WEAR AND CARE   Your Splint This splint should initially be fitted by a healthcare practitioner.  The healthcare practitioner is responsible for providing wearing instructions and precautions to the patient, other healthcare practitioners and care provider involved in the patient's care.  This splint was custom made for you. Please read the following instructions to learn about wearing and caring for your splint.  Precautions Should your splint cause any of the following problems, remove the splint immediately and contact your therapist/physician.  Swelling  Severe Pain  Pressure Areas  Stiffness  Numbness  Do not wear your splint while operating machinery unless it has been fabricated for that purpose.  When To Wear Your Splint Where your splint according to your therapist/physician instructions. Nights and rest periods only  Care and Cleaning of Your Splint 1. Keep your splint away from open flames. 2. Your splint will lose its shape in temperatures over 135 degrees Farenheit, ( in car windows, near radiators, ovens or in hot water).  Never make any adjustments to your splint, if the splint needs adjusting remove it and make an appointment to see your therapist. 3. Your splint, including the cushion liner may be cleaned with rubbing alcohol.  Do not immerse in hot water over 135 degrees Farenheit. 4. Straps may be washed with soap and water, but do not moisten the self-adhesive portion.

## 2016-02-20 NOTE — Therapy (Signed)
[facility] [address] [place], [place], [address] Phone: [phone]   Fax:  [phone]  Occupational Therapy Treatment  Patient Details  Name: Shawn Ruiz MRN: [id number] Date of Birth: 1951/03/06 Referring Provider: Dr. Curt Bears  Encounter Date: 02/20/2016      OT End of Session - 02/20/16 0944    Visit Number 2   Number of Visits 16   Date for OT Re-Evaluation 04/11/16   Authorization Type BCBS   Authorization Time Period awaiting info regarding possible insurance limitations   OT Start Time 0845   OT Stop Time 0930   OT Time Calculation (min) 45 min   Equipment Utilized During Treatment splint   Activity Tolerance Patient tolerated treatment well      Past Medical History  Diagnosis Date  . Essential hypertension, benign   . Mixed hyperlipidemia   . Prediabetes   . OA (osteoarthritis)   . Seasonal allergies   . Adenomatous colon polyp   . Fatty liver     Past Surgical History  Procedure Laterality Date  . Total hip arthroplasty Right   . Rotator cuff repair Right   . Knee surgery Left     torn meniscus    There were no vitals filed for this visit.      Subjective Assessment - 02/20/16 0857    Subjective  I don't have pain today, just weakness   Pertinent History see epic snapshot.    Patient Stated Goals I want to regain full function in my right hand.    Currently in Pain? No/denies                      OT Treatments/Exercises (OP) - 02/20/16 0001    Splinting   Splinting Fabricated and fitted resting hand splint for pm wear. Issued splint and educated in wear and care. Pt also instructed to wear during the day initially to monitor and once pt can tolerate 5 consecutive hours w/o problems, then switch to night time - pt agreed                OT Education - 02/20/16 0929    Education provided Yes   Education Details Resting hand splint wear and care   Person(s) Educated Patient   Methods Explanation;Demonstration;Handout   Comprehension Verbalized understanding;Returned demonstration          OT Short Term Goals - 02/15/16 1505    OT SHORT TERM GOAL #1   Title Pt will be mod I i with HEP - 03/14/2016   Status New   OT SHORT TERM GOAL #2   Title Pt will be mod i with wear and care of splint - 03/14/2016   Status New   OT SHORT TERM GOAL #3   Title Pt will demonstrate increased wrist extension to 0* (baseline- -15*) to assist wtih functional use of hand.   Status New   OT SHORT TERM GOAL #4   Title Pt will be able to button top button and don tie with splint/brace prn   Status New           OT Long Term Goals - 02/15/16 1508    OT LONG TERM GOAL #1   Title Pt will be mod I with upgraded HEP - 04/11/2016   Status New   OT LONG TERM GOAL #2   Title Pt will demonstrate wirst extension to 5* to assist wtih hand function   Status New  OT LONG TERM GOAL #3   Title Pt will demonstrate partial  MCP extension with wrist stablized to increase functional use of R hand   Status New   OT LONG TERM GOAL #4   Title Pt will be able to write name legibly with R hand AE prn   Status New   OT LONG TERM GOAL #5   Title Pt will be be able to self feed with R hand for at least 50% of the meal   Status New               Plan - 02/20/16 0945    Clinical Impression Statement Pt fitted for resting hand splint today and appears to fit well - issued today   Rehab Potential Good   OT Frequency 2x / week   OT Duration 8 weeks   OT Treatment/Interventions Self-care/ADL training;Electrical Stimulation;Therapeutic exercise;Neuromuscular education;DME and/or AE instruction;Passive range of motion;Manual Therapy;Splinting;Therapeutic activities;Patient/family education   Plan assess/adjust resting hand splint prn, fabricate radial n. palsy splint for daytime wear   Consulted and Agree with Plan of Care Patient      Patient will benefit  from skilled therapeutic intervention in order to improve the following deficits and impairments:  Decreased range of motion, Decreased knowledge of use of DME, Decreased strength, Increased edema, Impaired UE functional use, Impaired sensation, Pain  Visit Diagnosis: Muscle weakness (generalized)  Stiffness of right wrist, not elsewhere classified    Problem List There are no active problems to display for this patient.   Carey Bullocks, OTR/L 02/20/2016, 9:46 AM  [facility] [address] [place], [place], [address] Phone: [phone]   Fax:  [phone]  Name: Shawn Ruiz MRN: [id number] Date of Birth: May 29, 1951

## 2016-02-22 ENCOUNTER — Ambulatory Visit: Payer: BLUE CROSS/BLUE SHIELD | Admitting: Occupational Therapy

## 2016-02-22 DIAGNOSIS — R208 Other disturbances of skin sensation: Secondary | ICD-10-CM

## 2016-02-22 DIAGNOSIS — M25631 Stiffness of right wrist, not elsewhere classified: Secondary | ICD-10-CM | POA: Diagnosis not present

## 2016-02-22 DIAGNOSIS — M6281 Muscle weakness (generalized): Secondary | ICD-10-CM

## 2016-02-22 DIAGNOSIS — M25521 Pain in right elbow: Secondary | ICD-10-CM | POA: Diagnosis not present

## 2016-02-22 NOTE — Patient Instructions (Addendum)
Your Splint This splint should initially be fitted by a healthcare practitioner.  The healthcare practitioner is responsible for providing wearing instructions and precautions to the patient, other healthcare practitioners and care provider involved in the patient's care.  This splint was custom made for you. Please read the following instructions to learn about wearing and caring for your splint.  Precautions Should your splint cause any of the following problems, remove the splint immediately and contact your therapist/physician.  Swelling  Severe Pain  Pressure Areas  Stiffness  Numbness  Do not wear your splint while operating machinery unless it has been fabricated for that purpose.  When To Wear Your Splint Where your splint according to your therapist/physician instructions.  Daytime as tolerated. Take off for showers, washing dishes, etc.   Care and Cleaning of Your Splint 1. Keep your splint away from open flames. 2. Your splint will lose its shape in temperatures over 135 degrees Farenheit, ( in car windows, near radiators, ovens or in hot water).  Never make any adjustments to your splint, if the splint needs adjusting remove it and make an appointment to see your therapist. 3. Clean your splint, with rubbing alcohol.  Do not immerse in hot water over 135 degrees Farenheit. 4. Straps may be washed with soap and water, but do not moisten the self-adhesive portion. 5. For ink or hard to remove spots use a scouring cleanser which contains chlorine.  Rinse the splint thoroughly after using chlorine cleanser. 6.

## 2016-02-22 NOTE — Therapy (Signed)
[facility] [address] [place], [place], [address] Phone: [phone]   Fax:  [phone]  Occupational Therapy Treatment  Patient Details  Name: Shawn Ruiz MRN: [id number] Date of Birth: 05/08/1951 Referring Provider: Dr. Curt Bears  Encounter Date: 02/22/2016      OT End of Session - 02/22/16 1109    Visit Number 3   Number of Visits 16   Date for OT Re-Evaluation 04/11/16   Authorization Type BCBS 30 visit limit combined PT/OT   OT Start Time 1017   OT Stop Time 1105   OT Time Calculation (min) 48 min   Activity Tolerance Patient tolerated treatment well   Behavior During Therapy [facility] for tasks assessed/performed      Past Medical History  Diagnosis Date  . Essential hypertension, benign   . Mixed hyperlipidemia   . Prediabetes   . OA (osteoarthritis)   . Seasonal allergies   . Adenomatous colon polyp   . Fatty liver     Past Surgical History  Procedure Laterality Date  . Total hip arthroplasty Right   . Rotator cuff repair Right   . Knee surgery Left     torn meniscus    There were no vitals filed for this visit.      Subjective Assessment - 02/22/16 1020    Subjective  No real pain, just a little numbness/tingling.  Pt reports a little more movement in wrist/fingers    Pertinent History see epic snapshot.    Patient Stated Goals I want to regain full function in my right hand.    Currently in Pain? No/denies                      OT Treatments/Exercises (OP) - 02/22/16 0001    Splinting   Splinting Fabricated and fitted R radial nerve palsy splint for daytime wear and improved RUE functional use.                  OT Education - 02/22/16 1051    Education provided Yes   Education Details Radial nerve palsy wear and care (daytime)   Person(s) Educated Patient   Methods Explanation;Demonstration;Handout   Comprehension Verbalized understanding           OT Short Term Goals - 02/15/16 1505    OT SHORT TERM GOAL #1   Title Pt will be mod I i with HEP - 03/14/2016   Status New   OT SHORT TERM GOAL #2   Title Pt will be mod i with wear and care of splint - 03/14/2016   Status New   OT SHORT TERM GOAL #3   Title Pt will demonstrate increased wrist extension to 0* (baseline- -15*) to assist wtih functional use of hand.   Status New   OT SHORT TERM GOAL #4   Title Pt will be able to button top button and don tie with splint/brace prn   Status New           OT Long Term Goals - 02/15/16 1508    OT LONG TERM GOAL #1   Title Pt will be mod I with upgraded HEP - 04/11/2016   Status New   OT LONG TERM GOAL #2   Title Pt will demonstrate wirst extension to 5* to assist wtih hand function   Status New   OT LONG TERM GOAL #3   Title Pt will demonstrate partial  MCP extension with wrist stablized to  increase functional use of R hand   Status New   OT LONG TERM GOAL #4   Title Pt will be able to write name legibly with R hand AE prn   Status New   OT LONG TERM GOAL #5   Title Pt will be be able to self feed with R hand for at least 50% of the meal   Status New               Plan - 02/22/16 1111    Clinical Impression Statement Pt reports not problem with resting hand splint and that his hand feels better when he wears it.  Pt fitted for R radial nerve palsy splint to help wth wrist positioning and MP ext to assist with grasp/release during RUE functional use.  Pt demo incr ease picking up objects with splint.   Rehab Potential Good   OT Frequency 2x / week   OT Duration 8 weeks   OT Treatment/Interventions Self-care/ADL training;Electrical Stimulation;Therapeutic exercise;Neuromuscular education;DME and/or AE instruction;Passive range of motion;Manual Therapy;Splinting;Therapeutic activities;Patient/family education   Plan check radial nerve palsy splint, RUE functional use   OT Home Exercise Plan Education issued:  resting hand  splint and radial nerve palsy splint wear/care   Consulted and Agree with Plan of Care Patient      Patient will benefit from skilled therapeutic intervention in order to improve the following deficits and impairments:  Decreased range of motion, Decreased knowledge of use of DME, Decreased strength, Increased edema, Impaired UE functional use, Impaired sensation, Pain  Visit Diagnosis: Muscle weakness (generalized)  Stiffness of right wrist, not elsewhere classified  Other disturbances of skin sensation    Problem List There are no active problems to display for this patient.   [facility] 02/22/2016, 11:15 AM  [facility] [address] [place], [place], [address] Phone: [phone]   Fax:  [phone]  Name: Shawn Ruiz MRN: [id number] Date of Birth: 07/25/1951  Vianne Bulls, OTR/L [facility] [address] [place], [place]  [address] [phone] phone [phone] 02/22/2016 11:15 AM

## 2016-02-26 ENCOUNTER — Encounter: Payer: Self-pay | Admitting: Occupational Therapy

## 2016-02-26 ENCOUNTER — Ambulatory Visit: Payer: BLUE CROSS/BLUE SHIELD | Admitting: Occupational Therapy

## 2016-02-26 DIAGNOSIS — M25631 Stiffness of right wrist, not elsewhere classified: Secondary | ICD-10-CM | POA: Diagnosis not present

## 2016-02-26 DIAGNOSIS — M25521 Pain in right elbow: Secondary | ICD-10-CM | POA: Diagnosis not present

## 2016-02-26 DIAGNOSIS — M6281 Muscle weakness (generalized): Secondary | ICD-10-CM | POA: Diagnosis not present

## 2016-02-26 DIAGNOSIS — R208 Other disturbances of skin sensation: Secondary | ICD-10-CM

## 2016-02-26 NOTE — Therapy (Signed)
[facility] [address] [place], [place], [address] Phone: [phone]   Fax:  [phone]  Occupational Therapy Treatment  Patient Details  Name: Shawn Ruiz MRN: [id number] Date of Birth: November 25, 1950 Referring Provider: Dr. Curt Bears  Encounter Date: 02/26/2016      OT End of Session - 02/26/16 0955    Visit Number 4   Number of Visits 16   Date for OT Re-Evaluation 04/11/16   Authorization Type BCBS 30 visit limit combined PT/OT   Authorization Time Period awaiting info regarding possible insurance limitations   OT Start Time 0801   OT Stop Time 0844   OT Time Calculation (min) 43 min   Activity Tolerance Patient tolerated treatment well      Past Medical History  Diagnosis Date  . Essential hypertension, benign   . Mixed hyperlipidemia   . Prediabetes   . OA (osteoarthritis)   . Seasonal allergies   . Adenomatous colon polyp   . Fatty liver     Past Surgical History  Procedure Laterality Date  . Total hip arthroplasty Right   . Rotator cuff repair Right   . Knee surgery Left     torn meniscus    There were no vitals filed for this visit.      Subjective Assessment - 02/26/16 0804    Pertinent History see epic snapshot.    Patient Stated Goals I want to regain full function in my right hand.    Currently in Pain? Yes   Pain Score 3    Pain Location Elbow   Pain Orientation Right   Pain Descriptors / Indicators Aching;Dull   Pain Type Acute pain   Pain Onset 1 to 4 weeks ago   Pain Frequency Intermittent   Aggravating Factors  sitting in one position too long, use it too much   Pain Relieving Factors avoiding above, lidocaine rub. Pt banged elbow very hard on marble counter opening a drawer. Continues to improve                      OT Treatments/Exercises (OP) - 02/26/16 0001    Exercises   Exercises Wrist;Hand   Wrist Exercises   Other wrist exercises Pt  demonstrating tightness in wirst extension, radial and ulnar deviation.  Provided stretching program for pt to do 3-4 times per day (see pt instruction for details) - pt able to return demonstrate all activities.  Also provided active wrist extension exercises in gravity eliminated plane - pt with improved active wrist extension in this position. Pt tolerating splints well.    Hand Exercises   Other Hand Exercises Focused on therapeutic activities to address RUE functional use for in hand manipulation with splint on.  Pt required increased time and RUE becomes tired proximally therefore pt required multiple rest breaks however manipulation skills also improving.                 OT Education - 02/26/16 [phone]    Education provided Yes   Education Details wrist exercises as well as acitve assistive stretching program.   Person(s) Educated Patient   Methods Explanation;Demonstration;Verbal cues;Handout   Comprehension Verbalized understanding;Returned demonstration          OT Short Term Goals - 02/26/16 0953    OT SHORT TERM GOAL #1   Title Pt will be mod I i with HEP - 03/14/2016   Status On-going   OT SHORT TERM GOAL #2  Title Pt will be mod i with wear and care of splint - 03/14/2016   Status Achieved   OT SHORT TERM GOAL #3   Title Pt will demonstrate increased wrist extension to 0* (baseline- -15*) to assist wtih functional use of hand.   Status On-going   OT SHORT TERM GOAL #4   Title Pt will be able to button top button and don tie with splint/brace prn   Status On-going           OT Long Term Goals - 02/26/16 0954    OT LONG TERM GOAL #1   Title Pt will be mod I with upgraded HEP - 04/11/2016   Status On-going   OT LONG TERM GOAL #2   Title Pt will demonstrate wirst extension to 5* to assist wtih hand function   Status On-going   OT LONG TERM GOAL #3   Title Pt will demonstrate partial  MCP extension with wrist stablized to increase functional use of R hand    Status On-going   OT LONG TERM GOAL #4   Title Pt will be able to write name legibly with R hand AE prn   Status On-going   OT LONG TERM GOAL #5   Title Pt will be be able to self feed with R hand for at least 50% of the meal   Status On-going               Plan - 02/26/16 0954    Clinical Impression Statement Pt progressing toward goals. Pt with improved active wrist extension.   Rehab Potential Good   OT Frequency 2x / week   OT Duration 8 weeks   OT Treatment/Interventions Self-care/ADL training;Electrical Stimulation;Therapeutic exercise;Neuromuscular education;DME and/or AE instruction;Passive range of motion;Manual Therapy;Splinting;Therapeutic activities;Patient/family education   Plan check HEP, RUE functional use, strengthening   OT Home Exercise Plan Education issued:  resting hand splint and radial nerve palsy splint wear/care, HEP 02/26/2016   Consulted and Agree with Plan of Care Patient      Patient will benefit from skilled therapeutic intervention in order to improve the following deficits and impairments:  Decreased range of motion, Decreased knowledge of use of DME, Decreased strength, Increased edema, Impaired UE functional use, Impaired sensation, Pain  Visit Diagnosis: Muscle weakness (generalized)  Stiffness of right wrist, not elsewhere classified  Other disturbances of skin sensation  Pain in right elbow    Problem List There are no active problems to display for this patient.   Quay Burow, OTR/L 02/26/2016, 9:56 AM  [facility] [address] [place], [place], [address] Phone: [phone]   Fax:  [phone]  Name: Shawn Ruiz MRN: [id number] Date of Birth: 10-07-1950

## 2016-02-26 NOTE — Patient Instructions (Addendum)
Home exercises for your hand:  Do these 3- 4 times per day.  Do no do more than the repetitions listed below.  These should not be painful - they should feel like a stretch but not pain. If you get pain, stop and let your therapist know.    1. Sit at table with your hands resting on the table, palms and fingers together.  Gentle stretch the right hand into extension and hold for a slow count of 5. Do not over stretch.  Return to starting position. Do 10 reps.  2.  Sit at table with your right hand palm down on a towel or pillow case. Use your left hand to stabilize your forearm. Walk your fingers so that your thumb comes closer to your belly. Hold for count of 5, then return to starting position.  Do 10.  3. Same position as #3 only walk your fingers so that your hand moves away from your belly. Hold for a count of 5.  Return to starting position. Do 10  4. Sit at table with the right side of your forearm resting on the table and your thumb facing the ceiling. It is fine for your fingers to bend. Use your left hand to stabilize your forearm.  Bring your wrist as far back into extension as you can. Hold for a count of 5 then return to starting position. Do 10.

## 2016-02-29 ENCOUNTER — Ambulatory Visit: Payer: BLUE CROSS/BLUE SHIELD | Admitting: Occupational Therapy

## 2016-02-29 DIAGNOSIS — M6281 Muscle weakness (generalized): Secondary | ICD-10-CM

## 2016-02-29 DIAGNOSIS — M25631 Stiffness of right wrist, not elsewhere classified: Secondary | ICD-10-CM

## 2016-02-29 DIAGNOSIS — R208 Other disturbances of skin sensation: Secondary | ICD-10-CM | POA: Diagnosis not present

## 2016-02-29 DIAGNOSIS — M25521 Pain in right elbow: Secondary | ICD-10-CM | POA: Diagnosis not present

## 2016-02-29 NOTE — Therapy (Addendum)
[facility] [address] [place], [place], [address] Phone: [phone]   Fax:  [phone]  Occupational Therapy Treatment  Patient Details  Name: Shawn Ruiz MRN: [id number] Date of Birth: [birth year] Referring Provider: Dr. Curt Bears  Encounter Date: 02/29/2016      OT End of Session - 02/29/16 1049    Visit Number 5   Number of Visits 16   Date for OT Re-Evaluation 04/11/16   Authorization Type [employer]BS 30 visit limit combined PT/OT   Authorization Time Period awaiting info regarding possible insurance limitations   OT Start Time 1020   OT Stop Time 1059   OT Time Calculation (min) 39 min   Activity Tolerance Patient tolerated treatment well   Behavior During Therapy [facility] for tasks assessed/performed      Past Medical History  Diagnosis Date  . Essential hypertension, benign   . Mixed hyperlipidemia   . Prediabetes   . OA (osteoarthritis)   . Seasonal allergies   . Adenomatous colon polyp   . Fatty liver     Past Surgical History  Procedure Laterality Date  . Total hip arthroplasty Right   . Rotator cuff repair Right   . Knee surgery Left     torn meniscus    There were no vitals filed for this visit.      Subjective Assessment - 02/29/16 1040    Subjective  Pt reports soreness with exercise--pt cautioned to take breaks and avoid too much activity and pt verbalized understanding   Pertinent History see epic snapshot.    Patient Stated Goals I want to regain full function in my right hand.    Currently in Pain? Yes   Pain Score 4    Pain Location --  forearm   Pain Orientation Right   Pain Descriptors / Indicators Aching   Pain Onset 1 to 4 weeks ago   Pain Frequency Intermittent   Aggravating Factors  exercise   Pain Relieving Factors rest                      OT Treatments/Exercises (OP) - 02/29/16 0001    Exercises   Exercises Wrist;Hand   Wrist Exercises   Wrist  Extension AAROM;15 reps;Right  gravity eliminated on table    Other wrist exercises Flipping large cards with splint with min-mod cues for compensation patterns with focus on supination and finger ext within splint   Additional Wrist Exercises   Hand Gripper with Medium Beads Picking up blocks using 25lbs sustained grip strength with min-mod difficulty (with splint) for incr grip strength   Hand Exercises   MCPJ Extension AAROM;Right;15 reps  gravity eliminated, blocking wrist on table   Other Hand Exercises AAROM finger ext with ball on table and forearm supported with min cues, and attempted finger ext AAROM on tabletop with compensation/difficulty    Other Hand Exercises Thumb AAROM/abduction over cylinder object with min cues                 OT Education - 02/29/16 1102    Education Details Additional exercises for HEP (AAROM)   Person(s) Educated Patient   Methods Explanation;Handout;Verbal cues;Demonstration   Comprehension Verbalized understanding;Returned demonstration;Verbal cues required          OT Short Term Goals - 02/26/16 0953    OT SHORT TERM GOAL #1   Title Pt will be mod I i with HEP - 03/14/2016   Status On-going   OT  SHORT TERM GOAL #2   Title Pt will be mod i with wear and care of splint - 03/14/2016   Status Achieved   OT SHORT TERM GOAL #3   Title Pt will demonstrate increased wrist extension to 0* (baseline- -15*) to assist wtih functional use of hand.   Status On-going   OT SHORT TERM GOAL #4   Title Pt will be able to button top button and don tie with splint/brace prn   Status On-going           OT Long Term Goals - 02/26/16 0954    OT LONG TERM GOAL #1   Title Pt will be mod I with upgraded HEP - 04/11/2016   Status On-going   OT LONG TERM GOAL #2   Title Pt will demonstrate wirst extension to 5* to assist wtih hand function   Status On-going   OT LONG TERM GOAL #3   Title Pt will demonstrate partial  MCP extension with wrist  stablized to increase functional use of R hand   Status On-going   OT LONG TERM GOAL #4   Title Pt will be able to write name legibly with R hand AE prn   Status On-going   OT LONG TERM GOAL #5   Title Pt will be be able to self feed with R hand for at least 50% of the meal   Status On-going               Plan - 02/29/16 1104    Clinical Impression Statement Pt is progressing with incr AAROM.  Pt cautioned against doing too much activity (monitor soreness), avoid compensation patterns  to reduce risk of injury.  Pt verbalized understanding.   Rehab Potential Good   OT Frequency 2x / week   OT Duration 8 weeks   OT Treatment/Interventions Self-care/ADL training;Electrical Stimulation;Therapeutic exercise;Neuromuscular education;DME and/or AE instruction;Passive range of motion;Manual Therapy;Splinting;Therapeutic activities;Patient/family education   Plan Check HEP, RUE functional use, strengthening   OT Home Exercise Plan Education issued:  resting hand splint and radial nerve palsy splint wear/care, HEP 02/26/2016, additional ex added 02/29/16   Consulted and Agree with Plan of Care Patient      Patient will benefit from skilled therapeutic intervention in order to improve the following deficits and impairments:  Decreased range of motion, Decreased knowledge of use of DME, Decreased strength, Increased edema, Impaired UE functional use, Impaired sensation, Pain  Visit Diagnosis: Muscle weakness (generalized)  Stiffness of right wrist, not elsewhere classified  Other disturbances of skin sensation    Problem List There are no active problems to display for this patient.   [facility] 02/29/2016, 12:11 PM  [facility] [address] [place], [place], [address] Phone: [phone]   Fax:  [phone]  Name: Shawn Ruiz MRN: [id number] Date of Birth: [birth year]  Vianne Bulls, OTR/L [facility] [address] [place], [place]  [address] [phone] phone [phone] 02/29/2016 12:11 PM

## 2016-02-29 NOTE — Patient Instructions (Addendum)
   Hold bottle with bottom facing up, Slide thumb up and over top of bottle.  Repeat 10 times slowly, 3x/day.  Place forearm on table with little finger on table and thumb facing up.  Hold/stabilize wrist in neutral position then try to extend fingers.   Hold 5sec, 10x, 3x/day  Support forearm on towel holding wrist straight, place ball under fingers and try to push ball out/in using just your fingers, 10x.  Move slowly.  **Do not overdo it!

## 2016-03-06 ENCOUNTER — Encounter: Payer: Self-pay | Admitting: Occupational Therapy

## 2016-03-06 ENCOUNTER — Ambulatory Visit: Payer: BLUE CROSS/BLUE SHIELD | Attending: Diagnostic Neuroimaging | Admitting: Occupational Therapy

## 2016-03-06 DIAGNOSIS — M6281 Muscle weakness (generalized): Secondary | ICD-10-CM | POA: Insufficient documentation

## 2016-03-06 DIAGNOSIS — M25521 Pain in right elbow: Secondary | ICD-10-CM | POA: Diagnosis not present

## 2016-03-06 DIAGNOSIS — R208 Other disturbances of skin sensation: Secondary | ICD-10-CM | POA: Diagnosis not present

## 2016-03-06 DIAGNOSIS — M25631 Stiffness of right wrist, not elsewhere classified: Secondary | ICD-10-CM | POA: Diagnosis not present

## 2016-03-06 NOTE — Therapy (Signed)
[facility] [address] [place], [place], [address] Phone: [phone]   Fax:  [phone]  Occupational Therapy Treatment  Patient Details  Name: Shawn Ruiz MRN: [id number] Date of Birth: 02-Nov-1950 Referring Provider: Dr. Curt Bears  Encounter Date: 03/06/2016      OT End of Session - 03/06/16 1418    Visit Number 6   Number of Visits 16   Date for OT Re-Evaluation 04/11/16   Authorization Type BCBS 30 visit limit combined PT/OT   Authorization Time Period awaiting info regarding possible insurance limitations   OT Start Time [id number]   OT Stop Time 1400   OT Time Calculation (min) 43 min   Activity Tolerance Patient tolerated treatment well      Past Medical History  Diagnosis Date  . Essential hypertension, benign   . Mixed hyperlipidemia   . Prediabetes   . OA (osteoarthritis)   . Seasonal allergies   . Adenomatous colon polyp   . Fatty liver     Past Surgical History  Procedure Laterality Date  . Total hip arthroplasty Right   . Rotator cuff repair Right   . Knee surgery Left     torn meniscus    There were no vitals filed for this visit.      Subjective Assessment - 03/06/16 1320    Subjective  I bumped my right elbow again - scraped it on the pool .    Pertinent History see epic snapshot.    Patient Stated Goals I want to regain full function in my right hand.    Currently in Pain? Yes   Pain Score 2    Pain Location Elbow   Pain Orientation Right   Pain Descriptors / Indicators Aching   Pain Type Acute pain   Pain Onset More than a month ago   Pain Frequency Intermittent   Aggravating Factors  moving it   Pain Relieving Factors rest                      OT Treatments/Exercises (OP) - 03/06/16 0001    ADLs   Writing Pt's hand fatigues with increased time spent in writing (a large portion of his job).  Pt issued brown foam to assist wthen writing for longer  durations of time.  Pt states this made writing much easier. Encouraged pt to use only for longer periods of writing. Pt able to verbalize understanding.    Exercises   Exercises Wrist;Hand   Wrist Exercises   Forearm Supination AAROM  15 reps x2   Wrist Extension AAROM  15 reps x2, gravity eliminated   Wrist Radial Deviation AAROM  15 reps   Wrist Ulnar Deviation AAROM  15 reps   Additional Wrist Exercises   Hand Gripper with Medium Beads 35# of resistance picking up one inch blocks (with splint on). Pt with only 3 drops and less proximal substitution with cueing.     Hand Exercises   Other Hand Exercises Pt requires mod to max cues for proximal subsitituion during activities.    Manual Therapy   Manual Therapy Joint mobilization;Soft tissue mobilization   Manual therapy comments Joint mob and soft tissue mob as pt is limited in supination, wrist extension, radial and ulnar deviation.  Pt with increased stiffness in these planes but no pain.  Pt with good results and states less stiffness.  OT Short Term Goals - 03/06/16 1415    OT SHORT TERM GOAL #1   Title Pt will be mod I i with HEP - 03/14/2016   Status On-going   OT SHORT TERM GOAL #2   Title Pt will be mod i with wear and care of splint - 03/14/2016   Status Achieved   OT SHORT TERM GOAL #3   Title Pt will demonstrate increased wrist extension to 0* (baseline- -15*) to assist wtih functional use of hand.   Status Achieved  35* against gravity   OT SHORT TERM GOAL #4   Title Pt will be able to button top button and don tie with splint/brace prn   Status On-going           OT Long Term Goals - 03/06/16 1416    OT LONG TERM GOAL #1   Title Pt will be mod I with upgraded HEP - 04/11/2016   Status On-going   OT LONG TERM GOAL #2   Title Pt will demonstrate wirst extension to 5* to assist wtih hand function   Status On-going   OT LONG TERM GOAL #3   Title Pt will demonstrate partial  MCP  extension with wrist stablized to increase functional use of R hand   Status On-going   OT LONG TERM GOAL #4   Title Pt will be able to write name legibly with R hand AE prn   Status Achieved   OT LONG TERM GOAL #5   Title Pt will be be able to self feed with R hand for at least 50% of the meal   Status Achieved               Plan - 03/06/16 1417    Clinical Impression Statement Pt making progress toward goals. Pt very motivated however requires mod to max cues to avoid substitution.   Rehab Potential Good   OT Frequency 2x / week   OT Duration 8 weeks   OT Treatment/Interventions Self-care/ADL training;Electrical Stimulation;Therapeutic exercise;Neuromuscular education;DME and/or AE instruction;Passive range of motion;Manual Therapy;Splinting;Therapeutic activities;Patient/family education   Plan RUE funcitonal use, AAROM, strengthening   OT Home Exercise Plan Education issued:  resting hand splint and radial nerve palsy splint wear/care, HEP 02/26/2016, additional ex added 02/29/16   Consulted and Agree with Plan of Care Patient      Patient will benefit from skilled therapeutic intervention in order to improve the following deficits and impairments:  Decreased range of motion, Decreased knowledge of use of DME, Decreased strength, Increased edema, Impaired UE functional use, Impaired sensation, Pain  Visit Diagnosis: Muscle weakness (generalized)  Stiffness of right wrist, not elsewhere classified  Other disturbances of skin sensation  Pain in right elbow    Problem List There are no active problems to display for this patient.   Quay Burow , OTR/L  03/06/2016, 2:19 PM  [facility] [address] [place], [place], [address] Phone: [phone]   Fax:  [phone]  Name: Damany Krikorian MRN: [id number] Date of Birth: 01/31/1951

## 2016-03-12 ENCOUNTER — Ambulatory Visit: Payer: BLUE CROSS/BLUE SHIELD | Admitting: Occupational Therapy

## 2016-03-12 DIAGNOSIS — R208 Other disturbances of skin sensation: Secondary | ICD-10-CM

## 2016-03-12 DIAGNOSIS — M25631 Stiffness of right wrist, not elsewhere classified: Secondary | ICD-10-CM | POA: Diagnosis not present

## 2016-03-12 DIAGNOSIS — M25521 Pain in right elbow: Secondary | ICD-10-CM | POA: Diagnosis not present

## 2016-03-12 DIAGNOSIS — M6281 Muscle weakness (generalized): Secondary | ICD-10-CM | POA: Diagnosis not present

## 2016-03-12 NOTE — Therapy (Addendum)
[facility] [address] [place], [place], [address] Phone: [phone]   Fax:  [phone]  Occupational Therapy Treatment  Patient Details  Name: Shawn Ruiz MRN: [id number] Date of Birth: 09/21/1950 Referring Provider: Dr. v Penumalli  Encounter Date: 03/12/2016      OT End of Session - 03/12/16 1043    Visit Number 7   Number of Visits 16   Date for OT Re-Evaluation 04/11/16   Authorization Type BCBS 30 visit limit combined PT/OT   Authorization Time Period awaiting info regarding possible insurance limitations   OT Start Time 1020   OT Stop Time 1100   OT Time Calculation (min) 40 min   Activity Tolerance Patient tolerated treatment well   Behavior During Therapy WFL for tasks assessed/performed      Past Medical History  Diagnosis Date  . Essential hypertension, benign   . Mixed hyperlipidemia   . Prediabetes   . OA (osteoarthritis)   . Seasonal allergies   . Adenomatous colon polyp   . Fatty liver     Past Surgical History  Procedure Laterality Date  . Total hip arthroplasty Right   . Rotator cuff repair Right   . Knee surgery Left     torn meniscus    There were no vitals filed for this visit.      Subjective Assessment - 03/12/16 1021    Subjective  Elbow is limited with flex due to bumping elbow approx 4 weeks ago   Pertinent History see epic snapshot.    Patient Stated Goals I want to regain full function in my right hand.    Currently in Pain? Yes   Pain Score 2    Pain Location Elbow   Pain Orientation Right   Pain Descriptors / Indicators Aching   Pain Type Acute pain   Pain Frequency Intermittent   Aggravating Factors  end range elbow flex/ext    Pain Relieving Factors rest        Exercises  Exercises Wrist;Hand  Wrist Exercises  Forearm Supination AAROM  20 reps x2  Wrist Extension AAROM  20 reps, gravity eliminated  Wrist Radial Deviation AAROM  15 reps   Wrist Ulnar Deviation AAROM  15 reps  Additional Wrist Exercises     Hand Exercises  Other Hand Exercises Pt requires mod to max cues for proximal subsitituion/compensation during activities, particularly with supination and attempts at finger/wrist ext     Exercises   Exercises Wrist;Hand   Wrist Exercises   Wrist Extension AAROM;15 reps;Right  gravity eliminated on table    Other wrist exercises Flipping large cards with splint with min-mod cues for compensation patterns with focus on supination and finger ext within splint (with use of mirror)   Additional Wrist Exercises   Hand Gripper  Picking up blocks using 35lbs sustained grip strength with min difficulty (with splint) for incr grip strength   Hand Exercises   MCPJ Extension AAROM;Right;15 reps  gravity eliminated, blocking wrist on table (card underneath), then with therapist assisting for AAROM       Other Hand Exercises Thumb AAROM/abduction over cylinder object with min cues       Functional reaching to place clothespins with 1-8lb resistance on vertical pole and removing with mod cues to avoid shoulder/trunk compensation and to extend elbow.                         OT Short Term Goals -   03/12/16 1050    OT SHORT TERM GOAL #1   Title Pt will be mod I i with HEP - 03/14/2016   Status Achieved   OT SHORT TERM GOAL #2   Title Pt will be mod i with wear and care of splint - 03/14/2016   Status Achieved   OT SHORT TERM GOAL #3   Title Pt will demonstrate increased wrist extension to 0* (baseline- -15*) to assist wtih functional use of hand.   Status Achieved  35* against gravity   OT SHORT TERM GOAL #4   Title Pt will be able to button top button and don tie with splint/brace prn   Status On-going  03/12/16 partially met, able to don tie, but elbow limitations interfere with top button           OT Long Term Goals - 03/06/16 1416    OT LONG TERM GOAL #1   Title Pt will be mod I with upgraded  HEP - 04/11/2016   Status On-going   OT LONG TERM GOAL #2   Title Pt will demonstrate wirst extension to 5* to assist wtih hand function   Status On-going   OT LONG TERM GOAL #3   Title Pt will demonstrate partial  MCP extension with wrist stablized to increase functional use of R hand   Status On-going   OT LONG TERM GOAL #4   Title Pt will be able to write name legibly with R hand AE prn   Status Achieved   OT LONG TERM GOAL #5   Title Pt will be be able to self feed with R hand for at least 50% of the meal   Status Achieved               Plan - 03/12/16 1045    Clinical Impression Statement Pt is progressing slowly towards goals.  Pt requires mod-max facilitation cueing to avoid compensation with RUE functional use.   Rehab Potential Good   OT Frequency 2x / week   OT Duration 8 weeks   OT Treatment/Interventions Self-care/ADL training;Electrical Stimulation;Therapeutic exercise;Neuromuscular education;DME and/or AE instruction;Passive range of motion;Manual Therapy;Splinting;Therapeutic activities;Patient/family education   Plan RUE functional use, AAROM, stengthening    OT Home Exercise Plan Education issued:  resting hand splint and radial nerve palsy splint wear/care, HEP 02/26/2016, additional ex added 02/29/16   Consulted and Agree with Plan of Care Patient      Patient will benefit from skilled therapeutic intervention in order to improve the following deficits and impairments:  Decreased range of motion, Decreased knowledge of use of DME, Decreased strength, Increased edema, Impaired UE functional use, Impaired sensation, Pain  Visit Diagnosis: Muscle weakness (generalized)  Stiffness of right wrist, not elsewhere classified  Other disturbances of skin sensation  Pain in right elbow    Problem List There are no active problems to display for this patient.   FREEMAN,ANGELA 03/12/2016, 10:53 AM  [facility] [address] [place], [place], [address] Phone: [phone]   Fax:  [phone]  Name: Shawn Ruiz MRN: [id number] Date of Birth: 06/17/1951  Angela Freeman, OTR/L [facility] [address] [place], [place]  [address] [phone] phone [phone] 03/12/2016 10:53 AM    

## 2016-03-14 ENCOUNTER — Ambulatory Visit: Payer: BLUE CROSS/BLUE SHIELD | Admitting: Occupational Therapy

## 2016-03-14 DIAGNOSIS — M25521 Pain in right elbow: Secondary | ICD-10-CM | POA: Diagnosis not present

## 2016-03-14 DIAGNOSIS — M6281 Muscle weakness (generalized): Secondary | ICD-10-CM

## 2016-03-14 DIAGNOSIS — R208 Other disturbances of skin sensation: Secondary | ICD-10-CM

## 2016-03-14 DIAGNOSIS — M25631 Stiffness of right wrist, not elsewhere classified: Secondary | ICD-10-CM | POA: Diagnosis not present

## 2016-03-14 NOTE — Therapy (Signed)
[facility] [address] [place], [place], [address] Phone: [phone]   Fax:  [phone]  Occupational Therapy Treatment  Patient Details  Name: Izyk Marty MRN: [id number] Date of Birth: 05/14/1951 Referring Provider: Dr. Curt Bears  Encounter Date: 03/14/2016      OT End of Session - 03/14/16 0939    Visit Number 8   Number of Visits 16   Date for OT Re-Evaluation 04/11/16   Authorization Type BCBS 30 visit limit combined PT/OT   OT Start Time 0845   OT Stop Time 0930   OT Time Calculation (min) 45 min   Equipment Utilized During Treatment estim   Activity Tolerance Patient tolerated treatment well      Past Medical History  Diagnosis Date  . Essential hypertension, benign   . Mixed hyperlipidemia   . Prediabetes   . OA (osteoarthritis)   . Seasonal allergies   . Adenomatous colon polyp   . Fatty liver     Past Surgical History  Procedure Laterality Date  . Total hip arthroplasty Right   . Rotator cuff repair Right   . Knee surgery Left     torn meniscus    There were no vitals filed for this visit.      Subjective Assessment - 03/14/16 0850    Subjective  No pain related to the radial n. palsy, just numbness. My elbow is still sore from bumping it    Pertinent History see epic snapshot.    Patient Stated Goals I want to regain full function in my right hand.    Currently in Pain? No/denies                      OT Treatments/Exercises (OP) - 03/14/16 0001    ADLs   ADL Comments Pt still requires mod cueing to prevent compensations including: shoulder hiking, using momentum or forced movement (vs. slow controlled movement), and rationale behind proper positioning, n. palsy progression, healing process.    Exercises   Exercises Wrist   Wrist Exercises   Forearm Supination AAROM;10 reps   Forearm Pronation AAROM;10 reps   Wrist Extension AROM;10 reps  against gravity  to neutral, then gravity elim. past neutral   Wrist Radial Deviation AROM;10 reps   Hand Exercises   Other Hand Exercises MP AA/ROM ext. ex's using small ball   Other Hand Exercises Verbally reviewed thumb AA/ROM abduction ex's over cylinder object and trying to lift light weight object keeping wrist neutral   Modalities   Modalities Electrical Stimulation   Electrical Stimulation   Electrical Stimulation Location dorsal forearm    Electrical Stimulation Action wrist and finger extension  Pt demo good finger extension, slight forearm pronation   Electrical Stimulation Parameters 50 pps, 250 pw, 10 sec. on/off cycle, x 10 minutes, Intensity = 18   Electrical Stimulation Goals Neuromuscular facilitation                  OT Short Term Goals - 03/12/16 1050    OT SHORT TERM GOAL #1   Title Pt will be mod I i with HEP - 03/14/2016   Status Achieved   OT SHORT TERM GOAL #2   Title Pt will be mod i with wear and care of splint - 03/14/2016   Status Achieved   OT SHORT TERM GOAL #3   Title Pt will demonstrate increased wrist extension to 0* (baseline- -15*) to assist wtih functional use of hand.  Status Achieved  35* against gravity   OT SHORT TERM GOAL #4   Title Pt will be able to button top button and don tie with splint/brace prn   Status On-going  03/12/16 partially met, able to don tie, but elbow limitations interfere with top button           OT Long Term Goals - 03/06/16 1416    OT LONG TERM GOAL #1   Title Pt will be mod I with upgraded HEP - 04/11/2016   Status On-going   OT LONG TERM GOAL #2   Title Pt will demonstrate wirst extension to 5* to assist wtih hand function   Status On-going   OT LONG TERM GOAL #3   Title Pt will demonstrate partial  MCP extension with wrist stablized to increase functional use of R hand   Status On-going   OT LONG TERM GOAL #4   Title Pt will be able to write name legibly with R hand AE prn   Status Achieved   OT LONG TERM GOAL  #5   Title Pt will be be able to self feed with R hand for at least 50% of the meal   Status Achieved               Plan - 03/14/16 0939    Clinical Impression Statement Pt responded well to estim with finger extension at MP joints and wrist to neutral. Pt requires mod cueing t/o session to perform ex's correctly and decreased awareness into proper body mechanics and healing process.    Rehab Potential Good   OT Frequency 2x / week   OT Duration 8 weeks   OT Treatment/Interventions Self-care/ADL training;Electrical Stimulation;Therapeutic exercise;Neuromuscular education;DME and/or AE instruction;Passive range of motion;Manual Therapy;Splinting;Therapeutic activities;Patient/family education   Plan continue functional use, ROM, strengthening as able, estim   OT Home Exercise Plan Education issued:  resting hand splint and radial nerve palsy splint wear/care, HEP 02/26/2016, additional ex added 02/29/16   Consulted and Agree with Plan of Care Patient      Patient will benefit from skilled therapeutic intervention in order to improve the following deficits and impairments:  Decreased range of motion, Decreased knowledge of use of DME, Decreased strength, Increased edema, Impaired UE functional use, Impaired sensation, Pain  Visit Diagnosis: Muscle weakness (generalized)  Stiffness of right wrist, not elsewhere classified  Other disturbances of skin sensation    Problem List There are no active problems to display for this patient.   Carey Bullocks, OTR/L 03/14/2016, 9:41 AM  [facility] [address] [place], [place], [address] Phone: [phone]   Fax:  [phone]  Name: Jozef Eisenbeis MRN: [id number] Date of Birth: 04-12-1951

## 2016-03-18 ENCOUNTER — Encounter: Payer: Self-pay | Admitting: Occupational Therapy

## 2016-03-18 ENCOUNTER — Ambulatory Visit: Payer: BLUE CROSS/BLUE SHIELD | Admitting: Occupational Therapy

## 2016-03-18 DIAGNOSIS — M25631 Stiffness of right wrist, not elsewhere classified: Secondary | ICD-10-CM | POA: Diagnosis not present

## 2016-03-18 DIAGNOSIS — M6281 Muscle weakness (generalized): Secondary | ICD-10-CM

## 2016-03-18 DIAGNOSIS — R208 Other disturbances of skin sensation: Secondary | ICD-10-CM

## 2016-03-18 DIAGNOSIS — M25521 Pain in right elbow: Secondary | ICD-10-CM | POA: Diagnosis not present

## 2016-03-18 NOTE — Therapy (Signed)
[facility] [address] [place], [place], [address] Phone: [phone]   Fax:  [phone]  Occupational Therapy Treatment  Patient Details  Name: Shawn Ruiz MRN: [id number] Date of Birth: 06/04/1951 Referring Provider: Dr. Curt Bears  Encounter Date: 03/18/2016      OT End of Session - 03/18/16 1044    Visit Number 9   Number of Visits 16   Date for OT Re-Evaluation 04/11/16   Authorization Type BCBS 30 visit limit combined PT/OT   Authorization Time Period awaiting info regarding possible insurance limitations   OT Start Time 1015   OT Stop Time 1100   OT Time Calculation (min) 45 min   Equipment Utilized During Treatment estim   Activity Tolerance Patient tolerated treatment well      Past Medical History  Diagnosis Date  . Essential hypertension, benign   . Mixed hyperlipidemia   . Prediabetes   . OA (osteoarthritis)   . Seasonal allergies   . Adenomatous colon polyp   . Fatty liver     Past Surgical History  Procedure Laterality Date  . Total hip arthroplasty Right   . Rotator cuff repair Right   . Knee surgery Left     torn meniscus    There were no vitals filed for this visit.      Subjective Assessment - 03/18/16 1020    Subjective  No pain related to the radial n. palsy, just numbness. My elbow is still sore from bumping it    Pertinent History see epic snapshot.    Patient Stated Goals I want to regain full function in my right hand.    Currently in Pain? No/denies                      OT Treatments/Exercises (OP) - 03/18/16 0001    Wrist Exercises   Other wrist exercises Wrist extension exercises in gravity eliminated plane and against gravity x 15 reps each (trying to keep finger extension as able in gravity elim. plane)    Other wrist exercises Supination/pronation with visual cues and verbal cues to prevent sh. hiking with supination x 10 reps each   Hand  Exercises   Other Hand Exercises MP AA/ROM ext. ex's using cards   Other Hand Exercises Gripper set at 20 lbs resistance to pick up blocks for sustained grip strength Rt hand (with splint on to prevent extreme wrist flexion). Thumb abduction AA/ROM ex's over bottle top x 10 reps with cues to prevent wrist flexion   Modalities   Modalities Electrical Stimulation   Electrical Stimulation   Electrical Stimulation Location dorsal forearm    Electrical Stimulation Action wrist and finger extension   Electrical Stimulation Parameters 50 pps, 250 pw, 10 sec. on/off cycle x 15 minutes, intensity = 17   Electrical Stimulation Goals Neuromuscular facilitation                  OT Short Term Goals - 03/12/16 1050    OT SHORT TERM GOAL #1   Title Pt will be mod I i with HEP - 03/14/2016   Status Achieved   OT SHORT TERM GOAL #2   Title Pt will be mod i with wear and care of splint - 03/14/2016   Status Achieved   OT SHORT TERM GOAL #3   Title Pt will demonstrate increased wrist extension to 0* (baseline- -15*) to assist wtih functional use of hand.   Status Achieved  35* against gravity   OT SHORT TERM GOAL #4   Title Pt will be able to button top button and don tie with splint/brace prn   Status On-going  03/12/16 partially met, able to don tie, but elbow limitations interfere with top button           OT Long Term Goals - 03/06/16 1416    OT LONG TERM GOAL #1   Title Pt will be mod I with upgraded HEP - 04/11/2016   Status On-going   OT LONG TERM GOAL #2   Title Pt will demonstrate wirst extension to 5* to assist wtih hand function   Status On-going   OT LONG TERM GOAL #3   Title Pt will demonstrate partial  MCP extension with wrist stablized to increase functional use of R hand   Status On-going   OT LONG TERM GOAL #4   Title Pt will be able to write name legibly with R hand AE prn   Status Achieved   OT LONG TERM GOAL #5   Title Pt will be be able to self feed with R hand  for at least 50% of the meal   Status Achieved               Plan - 03/18/16 1045    Clinical Impression Statement Pt continues to respond well to estim for finger and wrist extension. Pt making slow steady gains in recovery    OT Frequency 2x / week   OT Duration 8 weeks   OT Treatment/Interventions Self-care/ADL training;Electrical Stimulation;Therapeutic exercise;Neuromuscular education;DME and/or AE instruction;Passive range of motion;Manual Therapy;Splinting;Therapeutic activities;Patient/family education   Plan continue functional use and ex's, estim   OT Home Exercise Plan Education issued:  resting hand splint and radial nerve palsy splint wear/care, HEP 02/26/2016, additional ex added 02/29/16   Consulted and Agree with Plan of Care Patient      Patient will benefit from skilled therapeutic intervention in order to improve the following deficits and impairments:  Decreased range of motion, Decreased knowledge of use of DME, Decreased strength, Increased edema, Impaired UE functional use, Impaired sensation, Pain  Visit Diagnosis: Muscle weakness (generalized)  Other disturbances of skin sensation    Problem List There are no active problems to display for this patient.   Carey Bullocks, OTR/L 03/18/2016, 10:46 AM  [facility] [address] [place], [place], [address] Phone: [phone]   Fax:  [phone]  Name: Surya Schroeter MRN: [id number] Date of Birth: Nov 27, 1950

## 2016-03-21 ENCOUNTER — Ambulatory Visit: Payer: BLUE CROSS/BLUE SHIELD | Admitting: Occupational Therapy

## 2016-03-21 DIAGNOSIS — M25631 Stiffness of right wrist, not elsewhere classified: Secondary | ICD-10-CM | POA: Diagnosis not present

## 2016-03-21 DIAGNOSIS — M6281 Muscle weakness (generalized): Secondary | ICD-10-CM

## 2016-03-21 DIAGNOSIS — M25521 Pain in right elbow: Secondary | ICD-10-CM | POA: Diagnosis not present

## 2016-03-21 DIAGNOSIS — R208 Other disturbances of skin sensation: Secondary | ICD-10-CM

## 2016-03-21 NOTE — Therapy (Signed)
[facility] [address] [place], [place], [address] Phone: [phone]   Fax:  [phone]  Occupational Therapy Treatment  Patient Details  Name: Shawn Ruiz MRN: [id number] Date of Birth: May 13, 1951 Referring Provider: Dr. Curt Bears  Encounter Date: 03/21/2016      OT End of Session - 03/21/16 0929    Visit Number 10   Number of Visits 16   Date for OT Re-Evaluation 04/11/16   Authorization Type BCBS 30 visit limit combined PT/OT   Authorization Time Period awaiting info regarding possible insurance limitations   OT Start Time [phone]   OT Stop Time 0935   OT Time Calculation (min) 43 min   Equipment Utilized During Treatment estim   Activity Tolerance Patient tolerated treatment well      Past Medical History  Diagnosis Date  . Essential hypertension, benign   . Mixed hyperlipidemia   . Prediabetes   . OA (osteoarthritis)   . Seasonal allergies   . Adenomatous colon polyp   . Fatty liver     Past Surgical History  Procedure Laterality Date  . Total hip arthroplasty Right   . Rotator cuff repair Right   . Knee surgery Left     torn meniscus    There were no vitals filed for this visit.      Subjective Assessment - 03/21/16 0856    Subjective  no pain, just numbness   Pertinent History see epic snapshot.    Patient Stated Goals I want to regain full function in my right hand.    Currently in Pain? No/denies                      OT Treatments/Exercises (OP) - 03/21/16 0001    Wrist Exercises   Other wrist exercises Wrist extension exercises in gravity eliminated plane and against gravity x 10 reps each (trying to keep finger extension as able in gravity elim. plane)    Hand Exercises   Other Hand Exercises MP AA/ROM ext. ex's using ball. Clothespins activity for pinch strength - red to blue resistance   Other Hand Exercises Gripper set at 35 lbs resistance to pick up blocks  for sustained grip strength Rt hand (with splint on to prevent extreme wrist flexion). Thumb abduction AA/ROM ex's over bottle top x 10 reps with cues to prevent wrist flexion   Electrical Stimulation   Electrical Stimulation Location dorsal forearm    Electrical Stimulation Action wrist and finger extension   Electrical Stimulation Parameters 50 pps, 250 pw, 10 sec. on/off cycle x 15 minutes, Intensity = 22   Electrical Stimulation Goals Neuromuscular facilitation                  OT Short Term Goals - 03/12/16 1050    OT SHORT TERM GOAL #1   Title Pt will be mod I i with HEP - 03/14/2016   Status Achieved   OT SHORT TERM GOAL #2   Title Pt will be mod i with wear and care of splint - 03/14/2016   Status Achieved   OT SHORT TERM GOAL #3   Title Pt will demonstrate increased wrist extension to 0* (baseline- -15*) to assist wtih functional use of hand.   Status Achieved  35* against gravity   OT SHORT TERM GOAL #4   Title Pt will be able to button top button and don tie with splint/brace prn   Status On-going  03/12/16 partially met,  able to don tie, but elbow limitations interfere with top button           OT Long Term Goals - 03/06/16 1416    OT LONG TERM GOAL #1   Title Pt will be mod I with upgraded HEP - 04/11/2016   Status On-going   OT LONG TERM GOAL #2   Title Pt will demonstrate wirst extension to 5* to assist wtih hand function   Status On-going   OT LONG TERM GOAL #3   Title Pt will demonstrate partial  MCP extension with wrist stablized to increase functional use of R hand   Status On-going   OT LONG TERM GOAL #4   Title Pt will be able to write name legibly with R hand AE prn   Status Achieved   OT LONG TERM GOAL #5   Title Pt will be be able to self feed with R hand for at least 50% of the meal   Status Achieved               Plan - 03/21/16 0930    Clinical Impression Statement Pt making steady progress in wrist and finger extension. Unable  to perform full composite extension, however, and still requires tenodesis.   Rehab Potential Good   OT Frequency 2x / week   OT Duration 8 weeks   OT Treatment/Interventions Self-care/ADL training;Electrical Stimulation;Therapeutic exercise;Neuromuscular education;DME and/or AE instruction;Passive range of motion;Manual Therapy;Splinting;Therapeutic activities;Patient/family education   Plan continue functional use and ex's, estim   OT Home Exercise Plan Education issued:  resting hand splint and radial nerve palsy splint wear/care, HEP 02/26/2016, additional ex added 02/29/16   Consulted and Agree with Plan of Care Patient      Patient will benefit from skilled therapeutic intervention in order to improve the following deficits and impairments:  Decreased range of motion, Decreased knowledge of use of DME, Decreased strength, Increased edema, Impaired UE functional use, Impaired sensation, Pain  Visit Diagnosis: Muscle weakness (generalized)  Other disturbances of skin sensation  Stiffness of right wrist, not elsewhere classified    Problem List There are no active problems to display for this patient.   Carey Bullocks, OTR/L 03/21/2016, 9:34 AM  [facility] [address] [place], [place], [address] Phone: [phone]   Fax:  [phone]  Name: Shawn Ruiz MRN: [id number] Date of Birth: May 07, 1951

## 2016-03-28 ENCOUNTER — Ambulatory Visit: Payer: BLUE CROSS/BLUE SHIELD | Admitting: Occupational Therapy

## 2016-03-28 ENCOUNTER — Encounter: Payer: BLUE CROSS/BLUE SHIELD | Admitting: Occupational Therapy

## 2016-03-28 DIAGNOSIS — M6281 Muscle weakness (generalized): Secondary | ICD-10-CM

## 2016-03-28 DIAGNOSIS — M25631 Stiffness of right wrist, not elsewhere classified: Secondary | ICD-10-CM

## 2016-03-28 DIAGNOSIS — R208 Other disturbances of skin sensation: Secondary | ICD-10-CM | POA: Diagnosis not present

## 2016-03-28 DIAGNOSIS — M25521 Pain in right elbow: Secondary | ICD-10-CM | POA: Diagnosis not present

## 2016-03-28 NOTE — Therapy (Signed)
[facility] [address] [place], [place], [address] Phone: [phone]   Fax:  [phone]  Occupational Therapy Treatment  Patient Details  Name: Shawn Ruiz MRN: [id number] Date of Birth: 06/24/1951 Referring Provider: Dr. Curt Bears  Encounter Date: 03/28/2016      OT End of Session - 03/28/16 1404    Visit Number 11   Number of Visits 16   Date for OT Re-Evaluation 04/11/16   Authorization Type BCBS 30 visit limit combined PT/OT   Authorization Time Period awaiting info regarding possible insurance limitations   OT Start Time 1315   OT Stop Time 1405   OT Time Calculation (min) 50 min   Equipment Utilized During Treatment estim      Past Medical History:  Diagnosis Date  . Adenomatous colon polyp   . Essential hypertension, benign   . Fatty liver   . Mixed hyperlipidemia   . OA (osteoarthritis)   . Prediabetes   . Seasonal allergies     Past Surgical History:  Procedure Laterality Date  . KNEE SURGERY Left    torn meniscus  . ROTATOR CUFF REPAIR Right   . TOTAL HIP ARTHROPLASTY Right     There were no vitals filed for this visit.      Subjective Assessment - 03/28/16 1323    Pertinent History (P)  see epic snapshot.    Patient Stated Goals (P)  I want to regain full function in my right hand.    Currently in Pain? (P)  No/denies                      OT Treatments/Exercises (OP) - 03/28/16 0001      ADLs   ADL Comments Pt trained in how to use home NMES device for wrist/finger extension. Trained pt in proper set up (parameters), electrode placement and care, intensity, and safety considerations including: never moving electrodes while machine on, and only turning intensity up during "on" cycle. Pt verbalized understanding. Therapist also wrote parameters down for patient in case it ever defaults to original setting.      Acupuncturist  Location dorsal forearm    Electrical Stimulation Action wrist and finger extension   Electrical Stimulation Parameters Ramp 2 sec., 50 pulse rate, 250 pulse width, 10 sec. on/off cycle x 20 minutes with pt's home device (Intelect NMES). Intensity b/t 3-4   Electrical Stimulation Goals Neuromuscular facilitation                  OT Short Term Goals - 03/12/16 1050      OT SHORT TERM GOAL #1   Title Pt will be mod I i with HEP - 03/14/2016   Status Achieved     OT SHORT TERM GOAL #2   Title Pt will be mod i with wear and care of splint - 03/14/2016   Status Achieved     OT SHORT TERM GOAL #3   Title Pt will demonstrate increased wrist extension to 0* (baseline- -15*) to assist wtih functional use of hand.   Status Achieved  35* against gravity     OT SHORT TERM GOAL #4   Title Pt will be able to button top button and don tie with splint/brace prn   Status On-going  03/12/16 partially met, able to don tie, but elbow limitations interfere with top button           OT Long Term Goals -  03/06/16 1416      OT LONG TERM GOAL #1   Title Pt will be mod I with upgraded HEP - 04/11/2016   Status On-going     OT LONG TERM GOAL #2   Title Pt will demonstrate wirst extension to 5* to assist wtih hand function   Status On-going     OT LONG TERM GOAL #3   Title Pt will demonstrate partial  MCP extension with wrist stablized to increase functional use of R hand   Status On-going     OT LONG TERM GOAL #4   Title Pt will be able to write name legibly with R hand AE prn   Status Achieved     OT LONG TERM GOAL #5   Title Pt will be be able to self feed with R hand for at least 50% of the meal   Status Achieved               Plan - 03/28/16 1702    Clinical Impression Statement Pt making steady progress. Independent with home NMES device now.    Rehab Potential Good   OT Frequency 2x / week   OT Duration 8 weeks   OT Treatment/Interventions Self-care/ADL  training;Electrical Stimulation;Therapeutic exercise;Neuromuscular education;DME and/or AE instruction;Passive range of motion;Manual Therapy;Splinting;Therapeutic activities;Patient/family education   Plan continue functional use of Rt hand, estim, anticipate d/c within next 2 weeks   OT Home Exercise Plan Education issued:  resting hand splint and radial nerve palsy splint wear/care, HEP 02/26/2016, additional ex added 02/29/16   Consulted and Agree with Plan of Care Patient      Patient will benefit from skilled therapeutic intervention in order to improve the following deficits and impairments:  Decreased range of motion, Decreased knowledge of use of DME, Decreased strength, Increased edema, Impaired UE functional use, Impaired sensation, Pain  Visit Diagnosis: Muscle weakness (generalized)  Stiffness of right wrist, not elsewhere classified    Problem List There are no active problems to display for this patient.   Carey Bullocks, OTR/L 03/28/2016, 5:06 PM  [facility] [address] [place], [place], [address] Phone: [phone]   Fax:  [phone]  Name: Shawn Ruiz MRN: [id number] Date of Birth: 08-19-1951

## 2016-04-02 ENCOUNTER — Ambulatory Visit: Payer: BLUE CROSS/BLUE SHIELD | Attending: Diagnostic Neuroimaging | Admitting: Occupational Therapy

## 2016-04-02 DIAGNOSIS — M6281 Muscle weakness (generalized): Secondary | ICD-10-CM | POA: Diagnosis not present

## 2016-04-02 DIAGNOSIS — M25631 Stiffness of right wrist, not elsewhere classified: Secondary | ICD-10-CM

## 2016-04-02 DIAGNOSIS — M25521 Pain in right elbow: Secondary | ICD-10-CM | POA: Insufficient documentation

## 2016-04-02 DIAGNOSIS — R208 Other disturbances of skin sensation: Secondary | ICD-10-CM | POA: Diagnosis not present

## 2016-04-02 NOTE — Therapy (Signed)
[facility] [address] [place], [place], [address] Phone: [phone]   Fax:  [phone]  Occupational Therapy Treatment  Patient Details  Name: Shawn Ruiz MRN: [id number] Date of Birth: September 18, 1950 Referring Provider: Dr. Curt Bears  Encounter Date: 04/02/2016      OT End of Session - 04/02/16 0923    Visit Number 12   Number of Visits 16   Date for OT Re-Evaluation 04/11/16   Authorization Type BCBS 30 visit limit combined PT/OT   OT Start Time 0900  pt arrived 15 min. late   OT Stop Time 0930   OT Time Calculation (min) 30 min   Equipment Utilized During Treatment estim   Activity Tolerance Patient tolerated treatment well      Past Medical History:  Diagnosis Date  . Adenomatous colon polyp   . Essential hypertension, benign   . Fatty liver   . Mixed hyperlipidemia   . OA (osteoarthritis)   . Prediabetes   . Seasonal allergies     Past Surgical History:  Procedure Laterality Date  . KNEE SURGERY Left    torn meniscus  . ROTATOR CUFF REPAIR Right   . TOTAL HIP ARTHROPLASTY Right     There were no vitals filed for this visit.      Subjective Assessment - 04/02/16 0900    Subjective  The home estim unit is working well   Pertinent History see epic snapshot.    Patient Stated Goals I want to regain full function in my right hand.    Currently in Pain? No/denies                      OT Treatments/Exercises (OP) - 04/02/16 0001      Hand Exercises   Other Hand Exercises Finger MP extension with card activity. Clothespin activity for pinch strength w/o splint, however pt instructed to keep wrist neutral and required support from other hand to prevent wrist flexion   Other Hand Exercises Wrist extension exercises against gravity (with fingers flexed). Wrist ext ex's in gravity elim plane with cues to keep fingers open as able with wrist to neutral only      Electrical  Stimulation   Electrical Stimulation Location dorsal forearm    Electrical Stimulation Action wrist and finger extension   Electrical Stimulation Parameters 50 pps, 250 pw, 10 sec. on/off cycle, x 10 min. Int = 22   Electrical Stimulation Goals Neuromuscular facilitation     Splinting   Splinting Added new hooks and provided extra strapping to radial n. palsy splint                  OT Short Term Goals - 04/02/16 0924      OT SHORT TERM GOAL #1   Title Pt will be mod I i with HEP - 03/14/2016   Status Achieved     OT SHORT TERM GOAL #2   Title Pt will be mod i with wear and care of splint - 03/14/2016   Status Achieved     OT SHORT TERM GOAL #3   Title Pt will demonstrate increased wrist extension to 0* (baseline- -15*) to assist wtih functional use of hand.   Status Achieved  35* against gravity     OT SHORT TERM GOAL #4   Title Pt will be able to button top button and don tie with splint/brace prn   Status Achieved  03/12/16 partially met, able to don  tie, but elbow limitations interfere with top button           OT Long Term Goals - 04/02/16 0924      OT LONG TERM GOAL #1   Title Pt will be mod I with upgraded HEP - 04/11/2016   Status Achieved     OT LONG TERM GOAL #2   Title Pt will demonstrate wirst extension to 5* to assist wtih hand function   Status Achieved  wrist ext 20-25 degrees with fingers flexed     OT LONG TERM GOAL #3   Title Pt will demonstrate partial  MCP extension with wrist stablized to increase functional use of R hand   Status On-going     OT LONG TERM GOAL #4   Title Pt will be able to write name legibly with R hand AE prn   Status Achieved     OT LONG TERM GOAL #5   Title Pt will be be able to self feed with R hand for at least 50% of the meal   Status Achieved  currently 95% of the time               Plan - 04/02/16 0211    Clinical Impression Statement Pt met STG #4 and LTG's #1 and #2. Pt remains limited in last  2 digits MP extension   Rehab Potential Good   OT Frequency 2x / week   OT Duration 8 weeks   OT Treatment/Interventions Self-care/ADL training;Electrical Stimulation;Therapeutic exercise;Neuromuscular education;DME and/or AE instruction;Passive range of motion;Manual Therapy;Splinting;Therapeutic activities;Patient/family education   Plan adjust splint for greater MP extension, continue estim (anticipate d/c upon return from vacation)   OT Home Exercise Plan Education issued:  resting hand splint and radial nerve palsy splint wear/care, HEP 02/26/2016, additional ex added 02/29/16   Consulted and Agree with Plan of Care Patient      Patient will benefit from skilled therapeutic intervention in order to improve the following deficits and impairments:  Decreased range of motion, Decreased knowledge of use of DME, Decreased strength, Increased edema, Impaired UE functional use, Impaired sensation, Pain  Visit Diagnosis: Muscle weakness (generalized)  Stiffness of right wrist, not elsewhere classified    Problem List There are no active problems to display for this patient.   Carey Bullocks, OTR/L 04/02/2016, 9:29 AM  [facility] [address] [place], [place], [address] Phone: [phone]   Fax:  [phone]  Name: Shawn Ruiz MRN: [id number] Date of Birth: 05/25/1951

## 2016-04-04 ENCOUNTER — Ambulatory Visit: Payer: BLUE CROSS/BLUE SHIELD | Admitting: Occupational Therapy

## 2016-04-04 DIAGNOSIS — M25631 Stiffness of right wrist, not elsewhere classified: Secondary | ICD-10-CM | POA: Diagnosis not present

## 2016-04-04 DIAGNOSIS — M6281 Muscle weakness (generalized): Secondary | ICD-10-CM

## 2016-04-04 DIAGNOSIS — M25521 Pain in right elbow: Secondary | ICD-10-CM | POA: Diagnosis not present

## 2016-04-04 DIAGNOSIS — R208 Other disturbances of skin sensation: Secondary | ICD-10-CM | POA: Diagnosis not present

## 2016-04-04 NOTE — Therapy (Signed)
[facility] [address] [place], [place], [address] Phone: [phone]   Fax:  [phone]  Occupational Therapy Treatment  Patient Details  Name: Excell Neyland MRN: [id number] Date of Birth: 07/01/1951 Referring Provider: Dr. Curt Bears  Encounter Date: 04/04/2016      OT End of Session - 04/04/16 1011    Visit Number 13   Number of Visits 16   Date for OT Re-Evaluation 04/11/16   Authorization Type BCBS 30 visit limit combined PT/OT   OT Start Time 0845   OT Stop Time 0930   OT Time Calculation (min) 45 min   Equipment Utilized During Treatment estim   Activity Tolerance Patient tolerated treatment well      Past Medical History:  Diagnosis Date  . Adenomatous colon polyp   . Essential hypertension, benign   . Fatty liver   . Mixed hyperlipidemia   . OA (osteoarthritis)   . Prediabetes   . Seasonal allergies     Past Surgical History:  Procedure Laterality Date  . KNEE SURGERY Left    torn meniscus  . ROTATOR CUFF REPAIR Right   . TOTAL HIP ARTHROPLASTY Right     There were no vitals filed for this visit.      Subjective Assessment - 04/04/16 0853    Subjective  I'm doing the estim 15 minutes at a time, 3x/day   Pertinent History see epic snapshot.    Patient Stated Goals I want to regain full function in my right hand.    Currently in Pain? No/denies                      OT Treatments/Exercises (OP) - 04/04/16 0001      Hand Exercises   Other Hand Exercises Wrist extension exercises against gravity (with fingers flexed). Wrist ext ex's in gravity elim plane with cues to keep fingers open as able with wrist to neutral only      Electrical Stimulation   Electrical Stimulation Location dorsal forearm    Electrical Stimulation Action wrist and finger extension   Electrical Stimulation Parameters 50 pps, 250 pw, 10 sec. on/off cycle x 15 minutes, Int = 18   Electrical  Stimulation Goals Neuromuscular facilitation     Splinting   Splinting Therapist adjusting splint with new tape to hold dynamic portion higher for > MP ext. and cleaning splint while pt simultaneously on estim                  OT Short Term Goals - 04/02/16 0924      OT SHORT TERM GOAL #1   Title Pt will be mod I i with HEP - 03/14/2016   Status Achieved     OT SHORT TERM GOAL #2   Title Pt will be mod i with wear and care of splint - 03/14/2016   Status Achieved     OT SHORT TERM GOAL #3   Title Pt will demonstrate increased wrist extension to 0* (baseline- -15*) to assist wtih functional use of hand.   Status Achieved  35* against gravity     OT SHORT TERM GOAL #4   Title Pt will be able to button top button and don tie with splint/brace prn   Status Achieved  03/12/16 partially met, able to don tie, but elbow limitations interfere with top button           OT Long Term Goals - 04/02/16 6606  OT LONG TERM GOAL #1   Title Pt will be mod I with upgraded HEP - 04/11/2016   Status Achieved     OT LONG TERM GOAL #2   Title Pt will demonstrate wirst extension to 5* to assist wtih hand function   Status Achieved  wrist ext 20-25 degrees with fingers flexed     OT LONG TERM GOAL #3   Title Pt will demonstrate partial  MCP extension with wrist stablized to increase functional use of R hand   Status On-going     OT LONG TERM GOAL #4   Title Pt will be able to write name legibly with R hand AE prn   Status Achieved     OT LONG TERM GOAL #5   Title Pt will be be able to self feed with R hand for at least 50% of the meal   Status Achieved  currently 95% of the time               Plan - 04/04/16 1012    Clinical Impression Statement Pt independent using home estim device. Pt remains limited in last 2 digits MP extension   Rehab Potential Good   OT Frequency 2x / week   OT Duration 8 weeks   OT Treatment/Interventions Self-care/ADL  training;Electrical Stimulation;Therapeutic exercise;Neuromuscular education;DME and/or AE instruction;Passive range of motion;Manual Therapy;Splinting;Therapeutic activities;Patient/family education   Plan pt on vacation for 1 week, pt to return for 1 visit after seeing neurologist and anticipate d/c at that time - assess remaining goal    OT Home Exercise Plan Education issued:  resting hand splint and radial nerve palsy splint wear/care, HEP 02/26/2016, additional ex added 02/29/16   Consulted and Agree with Plan of Care Patient      Patient will benefit from skilled therapeutic intervention in order to improve the following deficits and impairments:  Decreased range of motion, Decreased knowledge of use of DME, Decreased strength, Increased edema, Impaired UE functional use, Impaired sensation, Pain  Visit Diagnosis: Muscle weakness (generalized)  Stiffness of right wrist, not elsewhere classified    Problem List There are no active problems to display for this patient.   Carey Bullocks, OTR/L 04/04/2016, 10:14 AM  [facility] [address] [place], [place], [address] Phone: [phone]   Fax:  [phone]  Name: Yaakov Saindon MRN: [id number] Date of Birth: 1951/07/13

## 2016-04-15 ENCOUNTER — Ambulatory Visit: Payer: BLUE CROSS/BLUE SHIELD | Admitting: Occupational Therapy

## 2016-04-15 ENCOUNTER — Encounter: Payer: Self-pay | Admitting: Diagnostic Neuroimaging

## 2016-04-15 ENCOUNTER — Ambulatory Visit (INDEPENDENT_AMBULATORY_CARE_PROVIDER_SITE_OTHER): Payer: BLUE CROSS/BLUE SHIELD | Admitting: Diagnostic Neuroimaging

## 2016-04-15 VITALS — BP 122/70 | HR 82 | Wt 185.0 lb

## 2016-04-15 DIAGNOSIS — M6281 Muscle weakness (generalized): Secondary | ICD-10-CM

## 2016-04-15 DIAGNOSIS — M25631 Stiffness of right wrist, not elsewhere classified: Secondary | ICD-10-CM

## 2016-04-15 DIAGNOSIS — R208 Other disturbances of skin sensation: Secondary | ICD-10-CM | POA: Diagnosis not present

## 2016-04-15 DIAGNOSIS — G5631 Lesion of radial nerve, right upper limb: Secondary | ICD-10-CM

## 2016-04-15 DIAGNOSIS — M25521 Pain in right elbow: Secondary | ICD-10-CM | POA: Diagnosis not present

## 2016-04-15 NOTE — Patient Instructions (Addendum)
-   continue right hand / wrist exercises and splint usage  - follow up in 6 months or as needed

## 2016-04-15 NOTE — Progress Notes (Signed)
[facility]  PATIENT: Shawn Ruiz DOB: 1951-04-11  REFERRING CLINICIAN: Ehinger HISTORY FROM: patient  REASON FOR VISIT: follow up    HISTORICAL  CHIEF COMPLAINT:  Chief Complaint  Patient presents with  . Other    rm 7, radial neuropathy, right, "doing PT twice a week, completes this week; have made clear progress, wear splint on lower right arm"  . Follow-up    2 month    HISTORY OF PRESENT ILLNESS:   UPDATE 04/15/16: Since last visit, doing well. Slight gradual improvement in numbness and weakness of right hand/arm. OT sessions have been very helpful. Wearing a custom splint at day and night.   UPDATE 02/13/16: Since last visit patient's left foot drop resolved within a few months after last visit. 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 He was lifting and carrying, chopping down brush, as well as moving boxes at home. After 4 days of heavy exertion he noticed weakness in his right grip strength in right arm weakness. Numbness in right hand was noted. He was developing right wrist drop. Then while pulling open a door his finger slipped and he banged his right elbow against a hard object. In retrospect 15 years ago patient had an episode of left hand weakness associated with heavy exertion. No family history of similar problems. Patient has been using a brace on his right hand and elbow. Symptoms are slightly improving.  PRIOR HPI (02/27/12): 65 year old male with HTN, hyperchol, with left foot drop. 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 Felt tired in PM, fell asleep at table, but woke up and slept rest of night in bed. No trauma. June 22 - woke up with left foot weakness; went to local hospital and had workup. dx'd with left foot drop. Since then, slight improvement in symptoms.still with numbness and weakness. no problems in right foot, hands, or neck. no HA. no feveres, chills,  bowel/bladder problems. no back pain. 4-5 days prior to symptom onset, discovered "bug bite" mark on left knee. No tick found. Treated with doxycyclinge. Serum lyme test negative.   REVIEW OF SYSTEMS: Full 14 system review of systems performed and negative except as per HPI.     ALLERGIES: Allergies  Allergen Reactions  . Penicillins     Childhood, reaction unknown    HOME MEDICATIONS: Outpatient Medications Prior to Visit  Medication Sig Dispense Refill  . amLODipine (NORVASC) 5 MG tablet 5 mg daily.  12  . EPINEPHrine (EPI-PEN) 0.3 mg/0.3 mL SOAJ injection Inject 0.3 mLs (0.3 mg total) into the muscle once. 2 Device 0  . meloxicam (MOBIC) 15 MG tablet 15 mg daily.  6  . Multiple Vitamin (MULTIVITAMIN) tablet Take 1 tablet by mouth daily.    Marland Kitchen triamterene-hydrochlorothiazide (DYAZIDE) 37.5-25 MG per capsule Take 1 capsule by mouth every morning.     No facility-administered medications prior to visit.     PAST MEDICAL HISTORY: Past Medical History:  Diagnosis Date  . Adenomatous colon polyp   . Essential hypertension, benign   . Fatty liver   . Mixed hyperlipidemia   . OA (osteoarthritis)   . Prediabetes   . Seasonal allergies     PAST SURGICAL HISTORY: Past Surgical History:  Procedure Laterality Date  . KNEE SURGERY Left    torn meniscus  . ROTATOR CUFF REPAIR Right   . TOTAL HIP ARTHROPLASTY Right  FAMILY HISTORY: Family History  Problem Relation Age of Onset  . CAD Father   . Ulcers Mother   . Hypertension Mother   . CAD Mother   . Congestive Heart Failure Mother     SOCIAL HISTORY:  Social History   Social History  . Marital status: Married    Spouse name: carolyn  . Number of children: 4  . Years of education: PhD   Occupational History  .      [employer]   Social History Main Topics  . Smoking status: Never Smoker  . Smokeless tobacco: Never Used  . Alcohol use Yes     Comment: wine with dinner 4-5 nights a week  . Drug  use: No  . Sexual activity: Not on file   Other Topics Concern  . Not on file   Social History Narrative   Lives with wife     PHYSICAL EXAM  GENERAL EXAM/CONSTITUTIONAL: Vitals:  Vitals:   04/15/16 0833  BP: 122/70  Pulse: 82  Weight: 185 lb (83.9 kg)   Body mass index is 29.86 kg/m. No exam data present  Patient is in no distress; well developed, nourished and groomed; neck is supple  CARDIOVASCULAR:  Examination of carotid arteries is normal; no carotid bruits  Regular rate and rhythm, no murmurs  Examination of peripheral vascular system by observation and palpation is normal  EYES:  Ophthalmoscopic exam of optic discs and posterior segments is normal; no papilledema or hemorrhages  MUSCULOSKELETAL:  Gait, strength, tone, movements noted in Neurologic exam below  NEUROLOGIC: MENTAL STATUS:  No flowsheet data found.  awake, alert, oriented to person, place and time  recent and remote memory intact  normal attention and concentration  language fluent, comprehension intact, naming intact,   fund of knowledge appropriate  CRANIAL NERVE:   2nd - no papilledema on fundoscopic exam  2nd, 3rd, 4th, 6th - pupils equal and reactive to light, visual fields full to confrontation, extraocular muscles intact, no nystagmus  5th - facial sensation symmetric  7th - facial strength symmetric  8th - hearing intact  9th - palate elevates symmetrically, uvula midline  11th - shoulder shrug symmetric  12th - tongue protrusion midline  MOTOR:   normal bulk and tone, full strength in the LUE, BLE  RUE (TRICEPS 4, BRACHIORAD 4, SUPINATOR 3-4, WRIST EXT 3, FINGER EXT (digits 1-3: 3/5; digits 4-5: 2/5); GRIP 4; FINGER ABDUCTION 4)  SENSORY:   normal and symmetric to light touch, pinprick, temperature, vibration; EXCEPT DECR IN RIGHT THENAR REGION  COORDINATION:   finger-nose-finger, fine finger movements normal  REFLEXES:   deep tendon reflexes  present and symmetric  GAIT/STATION:   narrow based gait    DIAGNOSTIC DATA (LABS, IMAGING, TESTING) - I reviewed patient records, labs, notes, testing and imaging myself where available.  No results found for: WBC, HGB, HCT, MCV, PLT No results found for: NA, K, CL, CO2, GLUCOSE, BUN, CREATININE, CALCIUM, PROT, ALBUMIN, AST, ALT, ALKPHOS, BILITOT, GFRNONAA, GFRAA No results found for: CHOL, HDL, LDLCALC, LDLDIRECT, TRIG, CHOLHDL No results found for: HGBA1C No results found for: VITAMINB12 No results found for: TSH   03/11/12 EMG/NCS  1. 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 2. No electrodiagnostic evidence of left L5 radiculopathy or underlying polyneuropathy.    ASSESSMENT AND PLAN  64 y.o. year old male here with new onset right upper extremity weakness in setting of heavy exertion.  Symptoms localized mainly to right radial neuropathy. Interestingly patient had similar episode 4 years ago with left foot drop in setting of heavy exertion. May have had a similar episode affecting left hand 15 years ago. Therefore patient may have some predisposition for compression neuropathy such as the genetic condition hereditary neuropathy with liability for pressure palsies (HNPP).  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 Compression neuropathy vs HNPP  1. Radial neuropathy, right      PLAN: I spent 15 minutes of face to face time with patient. Greater than 50% of time was spent in counseling and coordination of care with patient. In summary we discussed:  - symptoms are gradually improving; therefore will continue exercises per occupational therapy - avoid pressure compression  Return in about 6 months (around 10/16/2016), or if symptoms worsen or fail to improve.    Penni Bombard, MD [id number], [id number] AM Certified in Neurology, Neurophysiology and  Neuroimaging  [facility] [address] [place], [place] [address] [phone]

## 2016-04-15 NOTE — Therapy (Signed)
[place] [address] [place], [place], [address] Phone: [phone]   Fax:  [phone]  Occupational Therapy Treatment  Patient Details  Name: Shawn Ruiz MRN: [id number] Date of Birth: Mar 22, 1951 Referring Provider: Dr. Curt Bears  Encounter Date: 04/15/2016      OT End of Session - 04/15/16 0940    Visit Number 14   Number of Visits 16   Date for OT Re-Evaluation 04/11/16   Authorization Type BCBS 30 visit limit combined PT/OT   OT Start Time 0935   OT Stop Time 1015   OT Time Calculation (min) 40 min   Equipment Utilized During Treatment estim   Activity Tolerance Patient tolerated treatment well   Behavior During Therapy [facility] for tasks assessed/performed      Past Medical History:  Diagnosis Date  . Adenomatous colon polyp   . Essential hypertension, benign   . Fatty liver   . Mixed hyperlipidemia   . OA (osteoarthritis)   . Prediabetes   . Seasonal allergies     Past Surgical History:  Procedure Laterality Date  . KNEE SURGERY Left    torn meniscus  . ROTATOR CUFF REPAIR Right   . TOTAL HIP ARTHROPLASTY Right     There were no vitals filed for this visit.      Subjective Assessment - 04/15/16 1314    Subjective  Just had my MD appt and he said to continue what I'm doing and keep my last 2 OT appts   Pertinent History see epic snapshot.    Patient Stated Goals I want to regain full function in my right hand.    Currently in Pain? No/denies         Pt inquires about appropriate activities for continuing fitness that he can do with RUE.  Instructed pt in yellow theraband HEP (low range) and recommended low resistance UBE as well.  Finger ext AROM with wrist blocked in neutral with difficulty and min-mod cues.  Finger extension to push cards off top of deck with min-mod cueing to block wrist and recommended pt wear pre-fab wrist splint that he has when doing ex at  home.              OT Treatments/Exercises (OP) - 04/15/16 0001      Electrical Stimulation   Electrical Stimulation Location dorsal forearm    Electrical Stimulation Action wrist/finger extension   Electrical Stimulation Parameters 50pps, 250 pulse width, 2sec ramp, 10sec cycle, intensity=20 with no adverse reactions    Electrical Stimulation Goals Neuromuscular facilitation                OT Education - 04/15/16 0954    Education Details Yellow Theraband HEP (low range), recommendation for use of arm bike (avoid weights due to risk of injury with compensation)   Person(s) Educated Patient   Methods Explanation;Demonstration;Verbal cues;Handout   Comprehension Verbalized understanding;Returned demonstration;Verbal cues required          OT Short Term Goals - 04/02/16 0924      OT SHORT TERM GOAL #1   Title Pt will be mod I i with HEP - 03/14/2016   Status Achieved     OT SHORT TERM GOAL #2   Title Pt will be mod i with wear and care of splint - 03/14/2016   Status Achieved     OT SHORT TERM GOAL #3   Title Pt will demonstrate increased wrist extension to 0* (baseline- -15*) to assist wtih  functional use of hand.   Status Achieved  35* against gravity     OT SHORT TERM GOAL #4   Title Pt will be able to button top button and don tie with splint/brace prn   Status Achieved  03/12/16 partially met, able to don tie, but elbow limitations interfere with top button           OT Long Term Goals - 04/02/16 0924      OT LONG TERM GOAL #1   Title Pt will be mod I with upgraded HEP - 04/11/2016   Status Achieved     OT LONG TERM GOAL #2   Title Pt will demonstrate wirst extension to 5* to assist wtih hand function   Status Achieved  wrist ext 20-25 degrees with fingers flexed     OT LONG TERM GOAL #3   Title Pt will demonstrate partial  MCP extension with wrist stablized to increase functional use of R hand   Status On-going     OT LONG TERM GOAL #4    Title Pt will be able to write name legibly with R hand AE prn   Status Achieved     OT LONG TERM GOAL #5   Title Pt will be be able to self feed with R hand for at least 50% of the meal   Status Achieved  currently 95% of the time               Plan - 04/15/16 6213    Clinical Impression Statement Pt reports that MD appt went well and that verbalizes that he is ready d/c next session.   Rehab Potential Good   OT Frequency 2x / week   OT Duration 8 weeks   OT Treatment/Interventions Self-care/ADL training;Electrical Stimulation;Therapeutic exercise;Neuromuscular education;DME and/or AE instruction;Passive range of motion;Manual Therapy;Splinting;Therapeutic activities;Patient/family education   Plan d/c next visit, check remaining goal   OT Home Exercise Plan Education issued:  resting hand splint and radial nerve palsy splint wear/care, HEP 02/26/2016, additional ex added 02/29/16   Consulted and Agree with Plan of Care Patient      Patient will benefit from skilled therapeutic intervention in order to improve the following deficits and impairments:  Decreased range of motion, Decreased knowledge of use of DME, Decreased strength, Increased edema, Impaired UE functional use, Impaired sensation, Pain  Visit Diagnosis: Muscle weakness (generalized)  Stiffness of right wrist, not elsewhere classified  Other disturbances of skin sensation  Pain in right elbow    Problem List There are no active problems to display for this patient.   [facility] 04/15/2016, 1:15 PM  [place] [address] [place], [place], [address] Phone: [phone]   Fax:  [phone]  Name: Shawn Ruiz MRN: [id number] Date of Birth: [birth year]   Vianne Bulls, OTR/L [facility] [address] [place], [place]  [address] [phone] phone [phone] 04/15/16 1:15 PM

## 2016-04-15 NOTE — Patient Instructions (Addendum)
Strengthening: Resisted Flexion   Attach tube to door.  Hold tubing with one arm at side. Pull forward and up. Move shoulder through pain-free range of motion. Repeat 15 times per set.  Do 1 sessions per day.    Strengthening: Resisted Extension   Attach one end to door.  Hold tubing in one hand, arm forward. Pull arm back, elbow straight. Repeat 15 times per set. Do 1 sessions per day.   Resisted Horizontal Abduction: Bilateral   Sit or stand, tubing in both hands, arms out in front. Keeping arms straight, pinch shoulder blades together and stretch arms out. Repeat 15 times per set.  Do 1 sessions per day.   Elbow Flexion: Resisted   Hold tubing wrapped around  One hand and and other end secured under foot, curl arm up as far as possible. Repeat 15 times per set.  Do 1 sessions per day.    Elbow Extension: Resisted   Hold band in left hand with elbow bent. Straighten  other elbow. Repeat 15 times per set.  Do 1 sessions per day.      INTERNAL ROTATION: Standing - Stable: Exercise Band (Active)    Stand, right arm bent to 90, elbow against side, forearm out from body. Against yellow resistance band, rotate arm in to body, keeping elbow at side. Complete 15 repetitions. Perform 1 sessions per day.  Copyright  VHI. All rights reserved.  EXTERNAL ROTATION: Standing - Stable: Exercise Band (Active)    Stand, right arm bent to 90, elbow against side, hand forward. Against yellow resistance band, rotate forearm outward, keeping elbow at side. Rotate forearm outward as far as possible. Complete 15 repetitions. Perform 1 sessions per day.    **Wear splint when doing exercises, don't overdo it

## 2016-04-18 ENCOUNTER — Ambulatory Visit: Payer: BLUE CROSS/BLUE SHIELD | Admitting: Occupational Therapy

## 2016-04-18 DIAGNOSIS — R208 Other disturbances of skin sensation: Secondary | ICD-10-CM | POA: Diagnosis not present

## 2016-04-18 DIAGNOSIS — M25521 Pain in right elbow: Secondary | ICD-10-CM | POA: Diagnosis not present

## 2016-04-18 DIAGNOSIS — M6281 Muscle weakness (generalized): Secondary | ICD-10-CM

## 2016-04-18 DIAGNOSIS — M25631 Stiffness of right wrist, not elsewhere classified: Secondary | ICD-10-CM | POA: Diagnosis not present

## 2016-04-18 NOTE — Therapy (Signed)
[place] [address] [place], [place], [address] Phone: [phone]   Fax:  [phone]  Occupational Therapy Treatment  Patient Details  Name: Shawn Ruiz MRN: [id number] Date of Birth: 1950/11/10 Referring Provider: Dr. Curt Bears  Encounter Date: 04/18/2016      OT End of Session - 04/18/16 0918    Visit Number 15   Number of Visits 16   Date for OT Re-Evaluation 04/11/16   Authorization Type BCBS 30 visit limit combined PT/OT   Authorization Time Period awaiting info regarding possible insurance limitations   OT Start Time 0845   OT Stop Time [phone]   OT Time Calculation (min) 43 min   Equipment Utilized During Treatment estim   Activity Tolerance Patient tolerated treatment well      Past Medical History:  Diagnosis Date  . Adenomatous colon polyp   . Essential hypertension, benign   . Fatty liver   . Mixed hyperlipidemia   . OA (osteoarthritis)   . Prediabetes   . Seasonal allergies     Past Surgical History:  Procedure Laterality Date  . KNEE SURGERY Left    torn meniscus  . ROTATOR CUFF REPAIR Right   . TOTAL HIP ARTHROPLASTY Right     There were no vitals filed for this visit.      Subjective Assessment - 04/18/16 0854    Pertinent History see epic snapshot.    Patient Stated Goals I want to regain full function in my right hand.    Currently in Pain? No/denies            [facility] OT Assessment - 04/18/16 0001      Observation/Other Assessments   Observations wrist ext = 25* (with fingers flexed).  MP extension digits 2-5 (with wrist stabalized in neutral):  0, -20*, -45*, -50*.      Hand Function   Right Hand Grip (lbs) 60 lbs   with wrist going into flexion                  OT Treatments/Exercises (OP) - 04/18/16 0001      ADLs   ADL Comments Assessed ROM and grip strength again for d/c.      Hand Exercises   Other Hand Exercises Gripper set at 55 lbs  resistance to pick up blocks but goes into extreme wrist flexion with min drops. Pt asked to stabalize wrist to prevent extreme wrist flexion     Electrical Stimulation   Electrical Stimulation Location dorsal forearm    Electrical Stimulation Action wrist and finger extension   Electrical Stimulation Parameters 50 pps, 250 pw, 10 sec. on/off cycle x 10 minutes   Electrical Stimulation Goals Neuromuscular facilitation                  OT Short Term Goals - 04/18/16 0910      OT SHORT TERM GOAL #1   Title Pt will be mod I i with HEP - 03/14/2016   Status Achieved     OT SHORT TERM GOAL #2   Title Pt will be mod i with wear and care of splint - 03/14/2016   Status Achieved     OT SHORT TERM GOAL #3   Title Pt will demonstrate increased wrist extension to 0* (baseline- -15*) to assist wtih functional use of hand.   Status Achieved  25* against gravity     OT SHORT TERM GOAL #4   Title Pt will be able  to button top button and don tie with splint/brace prn   Status Achieved           OT Long Term Goals - 04/18/16 0910      OT LONG TERM GOAL #1   Title Pt will be mod I with upgraded HEP - 04/11/2016   Status Achieved     OT LONG TERM GOAL #2   Title Pt will demonstrate wirst extension to 5* to assist wtih hand function   Status Achieved  wrist ext 25 degrees with fingers flexed     OT LONG TERM GOAL #3   Title Pt will demonstrate partial  MCP extension with wrist stablized to increase functional use of R hand   Status Partially Met  MCP extension full index finger, partially long finger, not met for ring and small finger     OT LONG TERM GOAL #4   Title Pt will be able to write name legibly with R hand AE prn   Status Achieved     OT LONG TERM GOAL #5   Title Pt will be be able to self feed with R hand for at least 50% of the meal   Status Achieved  currently 95% of the time               Plan - 04/18/16 0912    Clinical Impression Statement Pt  met all STG's and LTG's (partially met LTG #3). Pt still requires going into wrist flexion for gripping activities.    Plan D/C O.T. Pt advised to return in 3-6 months if further progress has been made, but could benefit from further O.T.    OT Home Exercise Plan Education issued:  resting hand splint and radial nerve palsy splint wear/care, HEP 02/26/2016, additional ex added 02/29/16   Consulted and Agree with Plan of Care Patient      Patient will benefit from skilled therapeutic intervention in order to improve the following deficits and impairments:     Visit Diagnosis: Muscle weakness (generalized)  Stiffness of right wrist, not elsewhere classified    Problem List There are no active problems to display for this patient.   OCCUPATIONAL THERAPY DISCHARGE SUMMARY  Visits from Start of Care: 15  Current functional level related to goals / functional outcomes: SEE ABOVE   Remaining deficits: Strength Rt hand and wrist ROM for wrist extension and MCP extension   Education / Equipment: HEP's, training in home estim unit, splint wear and care  Plan: Patient agrees to discharge.  Patient goals were met. Patient is being discharged due to meeting the stated rehab goals.  ?????        Carey Bullocks, OTR/L 04/18/2016, 9:21 AM  [facility] [address] [place], [place], [address] Phone: [phone]   Fax:  [phone]  Name: Shawn Ruiz MRN: [id number] Date of Birth: 1950/10/13

## 2016-07-04 DIAGNOSIS — M25512 Pain in left shoulder: Secondary | ICD-10-CM | POA: Diagnosis not present

## 2016-08-13 DIAGNOSIS — E7800 Pure hypercholesterolemia, unspecified: Secondary | ICD-10-CM | POA: Diagnosis not present

## 2016-08-13 DIAGNOSIS — Z23 Encounter for immunization: Secondary | ICD-10-CM | POA: Diagnosis not present

## 2016-08-13 DIAGNOSIS — I10 Essential (primary) hypertension: Secondary | ICD-10-CM | POA: Diagnosis not present

## 2016-08-13 DIAGNOSIS — K760 Fatty (change of) liver, not elsewhere classified: Secondary | ICD-10-CM | POA: Diagnosis not present

## 2016-08-13 DIAGNOSIS — R7303 Prediabetes: Secondary | ICD-10-CM | POA: Diagnosis not present

## 2016-08-20 DIAGNOSIS — M25512 Pain in left shoulder: Secondary | ICD-10-CM | POA: Diagnosis not present

## 2016-08-20 DIAGNOSIS — M6281 Muscle weakness (generalized): Secondary | ICD-10-CM | POA: Diagnosis not present

## 2016-08-20 DIAGNOSIS — M25561 Pain in right knee: Secondary | ICD-10-CM | POA: Diagnosis not present

## 2016-08-22 DIAGNOSIS — M25561 Pain in right knee: Secondary | ICD-10-CM | POA: Diagnosis not present

## 2016-08-22 DIAGNOSIS — M6281 Muscle weakness (generalized): Secondary | ICD-10-CM | POA: Diagnosis not present

## 2016-08-22 DIAGNOSIS — M25512 Pain in left shoulder: Secondary | ICD-10-CM | POA: Diagnosis not present

## 2016-10-16 ENCOUNTER — Ambulatory Visit (INDEPENDENT_AMBULATORY_CARE_PROVIDER_SITE_OTHER): Payer: BLUE CROSS/BLUE SHIELD | Admitting: Diagnostic Neuroimaging

## 2016-10-16 ENCOUNTER — Encounter: Payer: Self-pay | Admitting: Diagnostic Neuroimaging

## 2016-10-16 VITALS — BP 143/86 | HR 91 | Wt 190.0 lb

## 2016-10-16 DIAGNOSIS — G5631 Lesion of radial nerve, right upper limb: Secondary | ICD-10-CM

## 2016-10-16 NOTE — Progress Notes (Signed)
[facility]  PATIENT: Shawn Ruiz DOB: November 12, 1950  REFERRING CLINICIAN: Ehinger HISTORY FROM: patient  REASON FOR VISIT: follow up    HISTORICAL  CHIEF COMPLAINT:  Chief Complaint  Patient presents with  . Radial neuropathy, right    rm 6, "improvement in hand; able to do small tasks better"  . Follow-up    6 month    HISTORY OF PRESENT ILLNESS:   UPDATE 10/03/16: Since last visit, sxs are continuing to improve. Now 85% back to normal. Not needing the splint anymore.   UPDATE 04/15/16: Since last visit, doing well. Slight gradual improvement in numbness and weakness of right hand/arm. OT sessions have been very helpful. Wearing a custom splint at day and night.   UPDATE 02/13/16: Since last visit patient's left foot drop resolved within a few months after last visit. 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 He was lifting and carrying, chopping down brush, as well as moving boxes at home. After 4 days of heavy exertion he noticed weakness in his right grip strength in right arm weakness. Numbness in right hand was noted. He was developing right wrist drop. Then while pulling open a door his finger slipped and he banged his right elbow against a hard object. In retrospect 15 years ago patient had an episode of left hand weakness associated with heavy exertion. No family history of similar problems. Patient has been using a brace on his right hand and elbow. Symptoms are slightly improving.  PRIOR HPI (02/27/12): 66 year old male with HTN, hyperchol, with left foot drop. 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 Felt tired in PM, fell asleep at table, but woke up and slept rest of night in bed. No trauma. June 22 - woke up with left foot weakness; went to local hospital and had workup. dx'd with left foot drop. Since then, slight improvement in symptoms.still with numbness and weakness. no  problems in right foot, hands, or neck. no HA. no feveres, chills, bowel/bladder problems. no back pain. 4-5 days prior to symptom onset, discovered "bug bite" mark on left knee. No tick found. Treated with doxycyclinge. Serum lyme test negative.   REVIEW OF SYSTEMS: Full 14 system review of systems performed and negative except as per HPI.     ALLERGIES: Allergies  Allergen Reactions  . Penicillins     Childhood, reaction unknown    HOME MEDICATIONS: Outpatient Medications Prior to Visit  Medication Sig Dispense Refill  . amLODipine (NORVASC) 5 MG tablet 5 mg daily.  12  . EPINEPHrine (EPI-PEN) 0.3 mg/0.3 mL SOAJ injection Inject 0.3 mLs (0.3 mg total) into the muscle once. 2 Device 0  . meloxicam (MOBIC) 15 MG tablet 15 mg daily.  6  . Multiple Vitamin (MULTIVITAMIN) tablet Take 1 tablet by mouth daily.    Marland Kitchen triamterene-hydrochlorothiazide (DYAZIDE) 37.5-25 MG per capsule Take 1 capsule by mouth every morning.     No facility-administered medications prior to visit.     PAST MEDICAL HISTORY: Past Medical History:  Diagnosis Date  . Adenomatous colon polyp   . Essential hypertension, benign   . Fatty liver   . Mixed hyperlipidemia   . OA (osteoarthritis)   . Prediabetes   . Seasonal allergies     PAST SURGICAL HISTORY: Past Surgical History:  Procedure Laterality Date  . KNEE SURGERY Left    torn meniscus  . ROTATOR CUFF  REPAIR Right   . TOTAL HIP ARTHROPLASTY Right     FAMILY HISTORY: Family History  Problem Relation Age of Onset  . CAD Father   . Ulcers Mother   . Hypertension Mother   . CAD Mother   . Congestive Heart Failure Mother     SOCIAL HISTORY:  Social History   Social History  . Marital status: Married    Spouse name: carolyn  . Number of children: 4  . Years of education: PhD   Occupational History  .      [employer]   Social History Main Topics  . Smoking status: Never Smoker  . Smokeless tobacco: Never Used  . Alcohol  use Yes     Comment: wine with dinner 4-5 nights a week  . Drug use: No  . Sexual activity: Not on file   Other Topics Concern  . Not on file   Social History Narrative   Lives with wife     PHYSICAL EXAM  GENERAL EXAM/CONSTITUTIONAL: Vitals:  Vitals:   10/16/16 0946  BP: (!) 143/86  Pulse: 91  Weight: 190 lb (86.2 kg)   Body mass index is 30.67 kg/m. No exam data present  Patient is in no distress; well developed, nourished and groomed; neck is supple  CARDIOVASCULAR:  Regular rate and rhythm, no murmurs  Examination of peripheral vascular system by observation and palpation is normal   MUSCULOSKELETAL:  Gait, strength, tone, movements noted in Neurologic exam below  NEUROLOGIC: MENTAL STATUS:  No flowsheet data found.  awake, alert, oriented to person, place and time  recent and remote memory intact  normal attention and concentration  language fluent, comprehension intact, naming intact,   fund of knowledge appropriate  CRANIAL NERVE:   2nd - no papilledema on fundoscopic exam  2nd, 3rd, 4th, 6th - pupils equal and reactive to light, visual fields full to confrontation, extraocular muscles intact, no nystagmus  5th - facial sensation symmetric  7th - facial strength symmetric  8th - hearing intact  9th - palate elevates symmetrically, uvula midline  11th - shoulder shrug symmetric  12th - tongue protrusion midline  MOTOR:   normal bulk and tone, full strength in the LUE, BLE  RUE (TRICEPS 5, BRACHIORAD 5, WRIST EXT 5, FINGER EXT (digits 1-3: 5/5; digits 4-5: 4/5); FINGER ABDUCTION 5)  SENSORY:   normal and symmetric to light touch, pinprick, temperature, vibration; EXCEPT SLIGHT DECR PP IN RIGHT DORSUM B/W DIGIT 1 AND 2  COORDINATION:   finger-nose-finger, fine finger movements normal  REFLEXES:   deep tendon reflexes present and symmetric  GAIT/STATION:   narrow based gait    DIAGNOSTIC DATA (LABS, IMAGING,  TESTING) - I reviewed patient records, labs, notes, testing and imaging myself where available.  No results found for: WBC, HGB, HCT, MCV, PLT No results found for: NA, K, CL, CO2, GLUCOSE, BUN, CREATININE, CALCIUM, PROT, ALBUMIN, AST, ALT, ALKPHOS, BILITOT, GFRNONAA, GFRAA No results found for: CHOL, HDL, LDLCALC, LDLDIRECT, TRIG, CHOLHDL No results found for: HGBA1C No results found for: VITAMINB12 No results found for: TSH   03/11/12 EMG/NCS  1. 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 2. No electrodiagnostic evidence of left L5 radiculopathy or underlying polyneuropathy.    ASSESSMENT AND PLAN  66 y.o. year old male here with new onset right upper extremity weakness in setting of heavy exertion. Symptoms localized mainly to right radial neuropathy. Interestingly patient had  similar episode 4 years ago with left foot drop in setting of heavy exertion. May have had a similar episode affecting left hand 15 years ago. Therefore patient may have some predisposition for compression neuropathy such as the genetic condition hereditary neuropathy with liability for pressure palsies (HNPP).  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 Compression neuropathy vs HNPP  1. Radial neuropathy, right      PLAN: I spent 15 minutes of face to face time with patient. Greater than 50% of time was spent in counseling and coordination of care with patient. In summary we discussed:  - symptoms are gradually improving; therefore will continue exercises per occupational therapy - avoid pressure compression  Return if symptoms worsen or fail to improve, for return to PCP.    Penni Bombard, MD [id number], [id number] AM Certified in Neurology, Neurophysiology and Neuroimaging  [facility] [address] [place], [place] [address] [phone]

## 2017-02-11 DIAGNOSIS — I10 Essential (primary) hypertension: Secondary | ICD-10-CM | POA: Diagnosis not present

## 2017-02-11 DIAGNOSIS — Z125 Encounter for screening for malignant neoplasm of prostate: Secondary | ICD-10-CM | POA: Diagnosis not present

## 2017-02-11 DIAGNOSIS — E7800 Pure hypercholesterolemia, unspecified: Secondary | ICD-10-CM | POA: Diagnosis not present

## 2017-02-11 DIAGNOSIS — K760 Fatty (change of) liver, not elsewhere classified: Secondary | ICD-10-CM | POA: Diagnosis not present

## 2017-02-11 DIAGNOSIS — R7303 Prediabetes: Secondary | ICD-10-CM | POA: Diagnosis not present

## 2017-02-11 DIAGNOSIS — Z23 Encounter for immunization: Secondary | ICD-10-CM | POA: Diagnosis not present

## 2017-05-01 DIAGNOSIS — D229 Melanocytic nevi, unspecified: Secondary | ICD-10-CM | POA: Diagnosis not present

## 2017-09-09 DIAGNOSIS — Z0000 Encounter for general adult medical examination without abnormal findings: Secondary | ICD-10-CM | POA: Diagnosis not present

## 2017-09-09 DIAGNOSIS — I10 Essential (primary) hypertension: Secondary | ICD-10-CM | POA: Diagnosis not present

## 2017-09-09 DIAGNOSIS — R7303 Prediabetes: Secondary | ICD-10-CM | POA: Diagnosis not present

## 2017-09-09 DIAGNOSIS — E7800 Pure hypercholesterolemia, unspecified: Secondary | ICD-10-CM | POA: Diagnosis not present

## 2018-03-16 DIAGNOSIS — E7800 Pure hypercholesterolemia, unspecified: Secondary | ICD-10-CM | POA: Diagnosis not present

## 2018-03-16 DIAGNOSIS — K760 Fatty (change of) liver, not elsewhere classified: Secondary | ICD-10-CM | POA: Diagnosis not present

## 2018-03-16 DIAGNOSIS — R002 Palpitations: Secondary | ICD-10-CM | POA: Diagnosis not present

## 2018-03-16 DIAGNOSIS — R7303 Prediabetes: Secondary | ICD-10-CM | POA: Diagnosis not present

## 2018-03-16 DIAGNOSIS — I10 Essential (primary) hypertension: Secondary | ICD-10-CM | POA: Diagnosis not present

## 2018-04-10 DIAGNOSIS — L304 Erythema intertrigo: Secondary | ICD-10-CM | POA: Diagnosis not present

## 2018-05-15 DIAGNOSIS — L304 Erythema intertrigo: Secondary | ICD-10-CM | POA: Diagnosis not present

## 2018-06-24 DIAGNOSIS — M1711 Unilateral primary osteoarthritis, right knee: Secondary | ICD-10-CM | POA: Diagnosis not present

## 2018-06-24 DIAGNOSIS — M1712 Unilateral primary osteoarthritis, left knee: Secondary | ICD-10-CM | POA: Diagnosis not present

## 2018-06-29 DIAGNOSIS — M1712 Unilateral primary osteoarthritis, left knee: Secondary | ICD-10-CM | POA: Diagnosis not present

## 2018-06-29 DIAGNOSIS — M1711 Unilateral primary osteoarthritis, right knee: Secondary | ICD-10-CM | POA: Diagnosis not present

## 2018-07-03 DIAGNOSIS — M1711 Unilateral primary osteoarthritis, right knee: Secondary | ICD-10-CM | POA: Diagnosis not present

## 2018-07-03 DIAGNOSIS — M1712 Unilateral primary osteoarthritis, left knee: Secondary | ICD-10-CM | POA: Diagnosis not present

## 2018-09-30 DIAGNOSIS — Z0000 Encounter for general adult medical examination without abnormal findings: Secondary | ICD-10-CM | POA: Diagnosis not present

## 2018-09-30 DIAGNOSIS — R7303 Prediabetes: Secondary | ICD-10-CM | POA: Diagnosis not present

## 2018-09-30 DIAGNOSIS — I10 Essential (primary) hypertension: Secondary | ICD-10-CM | POA: Diagnosis not present

## 2018-09-30 DIAGNOSIS — E7800 Pure hypercholesterolemia, unspecified: Secondary | ICD-10-CM | POA: Diagnosis not present

## 2018-09-30 DIAGNOSIS — Z125 Encounter for screening for malignant neoplasm of prostate: Secondary | ICD-10-CM | POA: Diagnosis not present

## 2018-10-14 DIAGNOSIS — M170 Bilateral primary osteoarthritis of knee: Secondary | ICD-10-CM | POA: Diagnosis not present

## 2019-03-03 DIAGNOSIS — M1711 Unilateral primary osteoarthritis, right knee: Secondary | ICD-10-CM | POA: Diagnosis not present

## 2019-03-09 DIAGNOSIS — L304 Erythema intertrigo: Secondary | ICD-10-CM | POA: Diagnosis not present

## 2019-03-17 DIAGNOSIS — M170 Bilateral primary osteoarthritis of knee: Secondary | ICD-10-CM | POA: Diagnosis not present

## 2019-03-24 DIAGNOSIS — M170 Bilateral primary osteoarthritis of knee: Secondary | ICD-10-CM | POA: Diagnosis not present

## 2019-03-24 DIAGNOSIS — Z20828 Contact with and (suspected) exposure to other viral communicable diseases: Secondary | ICD-10-CM | POA: Diagnosis not present

## 2019-03-29 DIAGNOSIS — R7303 Prediabetes: Secondary | ICD-10-CM | POA: Diagnosis not present

## 2019-03-29 DIAGNOSIS — I10 Essential (primary) hypertension: Secondary | ICD-10-CM | POA: Diagnosis not present

## 2019-03-29 DIAGNOSIS — E7800 Pure hypercholesterolemia, unspecified: Secondary | ICD-10-CM | POA: Diagnosis not present

## 2019-03-29 DIAGNOSIS — K760 Fatty (change of) liver, not elsewhere classified: Secondary | ICD-10-CM | POA: Diagnosis not present

## 2019-03-31 DIAGNOSIS — M170 Bilateral primary osteoarthritis of knee: Secondary | ICD-10-CM | POA: Diagnosis not present

## 2019-04-07 DIAGNOSIS — H40023 Open angle with borderline findings, high risk, bilateral: Secondary | ICD-10-CM | POA: Diagnosis not present

## 2019-06-07 DIAGNOSIS — Z23 Encounter for immunization: Secondary | ICD-10-CM | POA: Diagnosis not present

## 2019-06-22 DIAGNOSIS — Z20828 Contact with and (suspected) exposure to other viral communicable diseases: Secondary | ICD-10-CM | POA: Diagnosis not present

## 2021-03-23 ENCOUNTER — Other Ambulatory Visit: Payer: Self-pay

## 2021-03-23 ENCOUNTER — Ambulatory Visit: Payer: Self-pay | Attending: Internal Medicine

## 2021-03-23 ENCOUNTER — Other Ambulatory Visit (HOSPITAL_BASED_OUTPATIENT_CLINIC_OR_DEPARTMENT_OTHER): Payer: Self-pay

## 2021-03-23 DIAGNOSIS — Z23 Encounter for immunization: Secondary | ICD-10-CM

## 2021-03-23 MED ORDER — PFIZER-BIONT COVID-19 VAC-TRIS 30 MCG/0.3ML IM SUSP
INTRAMUSCULAR | 0 refills | Status: DC
  Filled 2021-03-23: qty 0.3, 1d supply, fill #0

## 2021-03-23 NOTE — Progress Notes (Signed)
   Covid-19 Vaccination Clinic  Name:  Shawn Ruiz    MRN: [id number] DOB: 01-04-1951  03/23/2021  [name] was observed post Covid-19 immunization for 15 minutes without incident. He was provided with Vaccine Information Sheet and instruction to access the V-Safe system.   [name] was instructed to call 911 with any severe reactions post vaccine: Difficulty breathing  Swelling of face and throat  A fast heartbeat  A bad rash all over body  Dizziness and weakness   Immunizations Administered     Name Date Dose VIS Date Route   PFIZER Comrnaty(Gray TOP) Covid-19 Vaccine 03/23/2021  9:59 AM 0.3 mL 08/10/2020 Intramuscular   Manufacturer: [place]   Lot: [id number]   [place]: [phone]

## 2021-06-11 ENCOUNTER — Ambulatory Visit: Payer: Self-pay

## 2022-10-14 ENCOUNTER — Encounter (HOSPITAL_COMMUNITY): Payer: Self-pay | Admitting: Emergency Medicine

## 2022-10-14 ENCOUNTER — Ambulatory Visit (HOSPITAL_COMMUNITY)
Admission: EM | Admit: 2022-10-14 | Discharge: 2022-10-14 | Disposition: A | Discharge status: Home / Self Care | Payer: Self-pay | Attending: Internal Medicine | Admitting: Internal Medicine

## 2022-10-14 DIAGNOSIS — M79672 Pain in left foot: Secondary | ICD-10-CM | POA: Insufficient documentation

## 2022-10-14 LAB — CBC WITH DIFFERENTIAL/PLATELET
Abs Immature Granulocytes: 0.05 10*3/uL (ref 0.00–0.07)
Basophils Absolute: 0 10*3/uL (ref 0.0–0.1)
Basophils Relative: 0 %
Eosinophils Absolute: 0 10*3/uL (ref 0.0–0.5)
Eosinophils Relative: 0 %
HCT: 41.5 % (ref 39.0–52.0)
Hemoglobin: 14.3 g/dL (ref 13.0–17.0)
Immature Granulocytes: 1 %
Lymphocytes Relative: 7 %
Lymphs Abs: 0.6 10*3/uL — ABNORMAL LOW (ref 0.7–4.0)
MCH: 32 pg (ref 26.0–34.0)
MCHC: 34.5 g/dL (ref 30.0–36.0)
MCV: 92.8 fL (ref 80.0–100.0)
Monocytes Absolute: 0.8 10*3/uL (ref 0.1–1.0)
Monocytes Relative: 9 %
Neutro Abs: 7.3 10*3/uL (ref 1.7–7.7)
Neutrophils Relative %: 83 %
Platelets: 237 10*3/uL (ref 150–400)
RBC: 4.47 MIL/uL (ref 4.22–5.81)
RDW: 13.8 % (ref 11.5–15.5)
WBC: 8.7 10*3/uL (ref 4.0–10.5)
nRBC: 0 % (ref 0.0–0.2)

## 2022-10-14 LAB — BASIC METABOLIC PANEL
Anion gap: 13 (ref 5–15)
BUN: 25 mg/dL — ABNORMAL HIGH (ref 8–23)
CO2: 21 mmol/L — ABNORMAL LOW (ref 22–32)
Calcium: 9.3 mg/dL (ref 8.9–10.3)
Chloride: 100 mmol/L (ref 98–111)
Creatinine, Ser: 1.06 mg/dL (ref 0.61–1.24)
GFR, Estimated: >60 mL/min (ref >60)
Glucose, Bld: 169 mg/dL — ABNORMAL HIGH (ref 70–99)
Potassium: 4.2 mmol/L (ref 3.5–5.1)
Sodium: 134 mmol/L — ABNORMAL LOW (ref 135–145)

## 2022-10-14 LAB — URIC ACID: Uric Acid, Serum: 6.3 mg/dL (ref 3.7–8.6)

## 2022-10-14 MED ORDER — PREDNISONE 20 MG PO TABS: 60.0000 mg | ORAL_TABLET | Freq: Every day | ORAL | 0 refills | Status: AC

## 2022-10-14 MED ORDER — TRAMADOL HCL 50 MG PO TABS: 50.0000 mg | ORAL_TABLET | Freq: Four times a day (QID) | ORAL | 0 refills | Status: DC | PRN

## 2022-10-14 MED ORDER — HYDROCODONE-ACETAMINOPHEN 5-325 MG PO TABS
1.0000 | ORAL_TABLET | Freq: Once | ORAL | Status: AC
  Administered 2022-10-14: 1 via ORAL

## 2022-10-14 MED ORDER — HYDROCODONE-ACETAMINOPHEN 5-325 MG PO TABS
ORAL_TABLET | ORAL | Status: AC
  Filled 2022-10-14: qty 1

## 2022-10-14 NOTE — ED Triage Notes (Signed)
Tested positive for covid Tuesday, started taking paxlovid. Last night he noticed his left foot becoming extremely painful. PCP wanted him evaluated for a blood clot, gout, or infection. Reports at this point he cannot walk due to the pain. PMH of bilateral knee replacements. Left lower leg swollen, red, painful, warmth, tender to touch. Denies cough, SOB

## 2022-10-14 NOTE — ED Provider Notes (Signed)
[name]    CSN: [id number] Arrival date & time: 10/14/22  1643      History   Chief Complaint No chief complaint on file.   HPI Shawn Ruiz is a 72 y.o. male comes to the urgent care with sudden onset left foot pain.  Pain is throbbing and sharp and started this morning.  Patient was diagnosed with COVID-19 infection about 6 days ago and has completed a course of Paxlovid.  Last dose of Paxlovid was yesterday.  Patient feels better overall.  Patient denies any trauma to the left foot.  Pain is throbbing and aggravated by bearing weight.  No fever or chills.  Mild swelling of the first MTP of the left foot.  Patient denies any history of gout.  No changes in medications.  No calf pain or calf swelling.  Mild erythema of the first MTP of the left foot.   HPI  Past Medical History:  Diagnosis Date   Adenomatous colon polyp    Essential hypertension, benign    Fatty liver    Mixed hyperlipidemia    OA (osteoarthritis)    Prediabetes    Seasonal allergies     There are no problems to display for this patient.   Past Surgical History:  Procedure Laterality Date   KNEE SURGERY Left    torn meniscus   ROTATOR CUFF REPAIR Right    TOTAL HIP ARTHROPLASTY Right        Home Medications    Prior to Admission medications   Medication Sig Start Date End Date Taking? Authorizing Provider  ALPHAGAN P 0.1 % SOLN Apply to eye. 10/05/22  Yes [provider]  amLODipine (NORVASC) 5 MG tablet 5 mg daily. 01/31/16  Yes [provider]  meloxicam (MOBIC) 15 MG tablet 15 mg daily. 01/31/16  Yes [provider]  Multiple Vitamin (MULTIVITAMIN) tablet Take 1 tablet by mouth daily.   Yes [provider]  predniSONE (DELTASONE) 20 MG tablet Take 3 tablets (60 mg total) by mouth daily for 5 days. 10/14/22 10/19/22 Yes Lizzie Cokley, Myrene Galas, MD  traMADol (ULTRAM) 50 MG tablet Take 1 tablet (50 mg total) by mouth every 6 (six) hours as needed. 10/14/22   Yes Leshia Kope, Myrene Galas, MD  triamterene-hydrochlorothiazide (DYAZIDE) 37.5-25 MG per capsule Take 1 capsule by mouth every morning.   Yes [provider]  COVID-19 mRNA Vac-TriS, Pfizer, (PFIZER-BIONT COVID-19 VAC-TRIS) SUSP injection Inject into the muscle. 03/23/21   Carlyle Basques, MD  EPINEPHrine (EPI-PEN) 0.3 mg/0.3 mL SOAJ injection Inject 0.3 mLs (0.3 mg total) into the muscle once. 08/13/13   Corky Sox, MD    Family History Family History  Problem Relation Age of Onset   CAD Father    Ulcers Mother    Hypertension Mother    CAD Mother    Congestive Heart Failure Mother     Social History Social History   Tobacco Use   Smoking status: Never   Smokeless tobacco: Never  Substance Use Topics   Alcohol use: Yes    Comment: wine with dinner 4-5 nights a week   Drug use: No     Allergies   Penicillins   Review of Systems Review of Systems As per HPI  Physical Exam Triage Vital Signs ED Triage Vitals [10/14/22 1659]  Enc Vitals Group     BP 136/76     Pulse Rate (!) 101     Resp 16     Temp 99.2 F (37.3  C)     Temp Source Oral     SpO2 95 %     Weight      Height      Head Circumference      Peak Flow      Pain Score      Pain Loc      Pain Edu?      Excl. in [place]?    No data found.  Updated Vital Signs BP 136/76 (BP Location: Right Arm)   Pulse (!) 101   Temp 99.2 F (37.3 C) (Oral)   Resp 16   SpO2 95%   Visual Acuity Right Eye Distance:   Left Eye Distance:   Bilateral Distance:    Right Eye Near:   Left Eye Near:    Bilateral Near:     Physical Exam Vitals and nursing note reviewed.  Constitutional:      General: He is in acute distress.  Cardiovascular:     Rate and Rhythm: Normal rate and regular rhythm.     Pulses: Normal pulses.     Heart sounds: Normal heart sounds.  Musculoskeletal:        General: Swelling and tenderness present. Normal range of motion.     Comments: Tenderness and swelling of the first MTP  of the left foot.  Full range of motion of the MTP joint.  Tenderness on palpation of the bulb of the MTP joint.  Mild erythema noted.  No gross deformity.      UC Treatments / Results  Labs (all labs ordered are listed, but only abnormal results are displayed) Labs Reviewed  CBC WITH DIFFERENTIAL/PLATELET  BASIC METABOLIC PANEL  URIC ACID    EKG   Radiology No results found.  Procedures Procedures (including critical care time)  Medications Ordered in UC Medications  HYDROcodone-acetaminophen (NORCO/VICODIN) 5-325 MG per tablet 1 tablet (1 tablet Oral Given 10/14/22 1748)    Initial Impression / Assessment and Plan / UC Course  I have reviewed the triage vital signs and the nursing notes.  Pertinent labs & imaging results that were available during my care of the patient were reviewed by me and considered in my medical decision making (see chart for details).     1.  Acute left foot pain (acute gout flare versus reactive arthritis): CBC, CMP, uric acid Hydrocodone-acetaminophen x 1 dose Prednisone 60 mg orally daily for 5 days Tramadol 50 mg every 6-8 hours as needed for pain Return precautions given Patient will be updated on the lab results. Final Clinical Impressions(s) / UC Diagnoses   Final diagnoses:  Acute foot pain, left     Discharge Instructions      Rest and elevate the affected painful area.  Apply cold compresses intermittently as needed.   As pain recedes, begin normal activities slowly as tolerated.   We will call you with recommendations if labs are abnormal Please return to urgent care if symptoms worsen.       ED Prescriptions     Medication Sig Dispense Auth. Provider   predniSONE (DELTASONE) 20 MG tablet Take 3 tablets (60 mg total) by mouth daily for 5 days. 15 tablet Vaniya Augspurger, Myrene Galas, MD   traMADol (ULTRAM) 50 MG tablet Take 1 tablet (50 mg total) by mouth every 6 (six) hours as needed. 10 tablet Jerrel Tiberio, Myrene Galas, MD      I  have reviewed the PDMP during this encounter.   Chase Picket, MD 10/14/22 [phone]

## 2022-10-14 NOTE — Discharge Instructions (Addendum)
Rest and elevate the affected painful area.  Apply cold compresses intermittently as needed.   As pain recedes, begin normal activities slowly as tolerated.   We will call you with recommendations if labs are abnormal Please return to urgent care if symptoms worsen.

## 2023-01-14 LAB — HM COLONOSCOPY

## 2023-10-01 ENCOUNTER — Other Ambulatory Visit: Payer: Self-pay | Admitting: Orthopedic Surgery

## 2023-11-13 ENCOUNTER — Encounter (HOSPITAL_BASED_OUTPATIENT_CLINIC_OR_DEPARTMENT_OTHER): Payer: Self-pay

## 2023-11-13 ENCOUNTER — Ambulatory Visit (HOSPITAL_BASED_OUTPATIENT_CLINIC_OR_DEPARTMENT_OTHER): Admit: 2023-11-13 | Payer: Self-pay | Admitting: Orthopedic Surgery

## 2023-11-13 SURGERY — CARPAL TUNNEL RELEASE
Anesthesia: Choice | Laterality: Right

## 2023-12-04 LAB — CBC AND DIFFERENTIAL
HCT: 39 — AB (ref 41–53)
Hemoglobin: 12.6 — AB (ref 13.5–17.5)
Platelets: 190 K/uL (ref 150–400)
WBC: 4.9

## 2023-12-04 LAB — TSH: TSH: 1.19 (ref 0.41–5.90)

## 2023-12-04 LAB — PSA: PSA: 2.87

## 2023-12-05 LAB — LIPID PANEL
Cholesterol: 177 (ref 0–200)
HDL: 58 (ref 35–70)
LDL Cholesterol: 108
LDl/HDL Ratio: 3
Triglycerides: 54 (ref 40–160)

## 2023-12-05 LAB — HEMOGLOBIN A1C: Hemoglobin A1C: 5.5

## 2023-12-05 LAB — CBC: RBC: 4.52 (ref 3.87–5.11)

## 2023-12-05 LAB — HEPATIC FUNCTION PANEL
ALT: 24 U/L (ref 10–40)
AST: 27 (ref 14–40)

## 2023-12-05 LAB — COMPREHENSIVE METABOLIC PANEL WITH GFR
Albumin: 4.2 (ref 3.5–5.0)
Calcium: 9.4 (ref 8.7–10.7)
eGFR: 70

## 2023-12-05 LAB — BASIC METABOLIC PANEL WITH GFR
BUN: 23 — AB (ref 4–21)
CO2: 28 — AB (ref 13–22)
Chloride: 105 (ref 99–108)
Creatinine: 1.1 (ref 0.6–1.3)
Glucose: 110
Potassium: 4.2 meq/L (ref 3.5–5.1)
Sodium: 140 (ref 137–147)

## 2024-05-14 ENCOUNTER — Telehealth: Payer: Self-pay | Admitting: Family Medicine

## 2024-05-14 NOTE — Telephone Encounter (Signed)
 Patient states he is returning Tammy's call re: the possibility of becoming a new Patient. Requests to be called.

## 2024-05-17 NOTE — Telephone Encounter (Signed)
 Left patient VM to return my call.  OK to work in with Kennyth to establish care.

## 2024-05-31 ENCOUNTER — Encounter: Payer: Self-pay | Admitting: Family Medicine

## 2024-05-31 ENCOUNTER — Other Ambulatory Visit (HOSPITAL_COMMUNITY): Payer: Self-pay

## 2024-05-31 ENCOUNTER — Ambulatory Visit: Admitting: Family Medicine

## 2024-05-31 ENCOUNTER — Telehealth: Payer: Self-pay

## 2024-05-31 VITALS — BP 118/67 | HR 82 | Temp 97.9°F | Ht 66.0 in | Wt 200.6 lb

## 2024-05-31 DIAGNOSIS — K219 Gastro-esophageal reflux disease without esophagitis: Secondary | ICD-10-CM

## 2024-05-31 DIAGNOSIS — M47816 Spondylosis without myelopathy or radiculopathy, lumbar region: Secondary | ICD-10-CM

## 2024-05-31 DIAGNOSIS — J302 Other seasonal allergic rhinitis: Secondary | ICD-10-CM

## 2024-05-31 DIAGNOSIS — T782XXD Anaphylactic shock, unspecified, subsequent encounter: Secondary | ICD-10-CM

## 2024-05-31 DIAGNOSIS — T782XXA Anaphylactic shock, unspecified, initial encounter: Secondary | ICD-10-CM | POA: Insufficient documentation

## 2024-05-31 DIAGNOSIS — M1990 Unspecified osteoarthritis, unspecified site: Secondary | ICD-10-CM | POA: Diagnosis not present

## 2024-05-31 DIAGNOSIS — M720 Palmar fascial fibromatosis [Dupuytren]: Secondary | ICD-10-CM

## 2024-05-31 DIAGNOSIS — I10 Essential (primary) hypertension: Secondary | ICD-10-CM | POA: Diagnosis not present

## 2024-05-31 DIAGNOSIS — H3530 Unspecified macular degeneration: Secondary | ICD-10-CM | POA: Insufficient documentation

## 2024-05-31 DIAGNOSIS — N529 Male erectile dysfunction, unspecified: Secondary | ICD-10-CM

## 2024-05-31 DIAGNOSIS — Z87892 Personal history of anaphylaxis: Secondary | ICD-10-CM

## 2024-05-31 DIAGNOSIS — K760 Fatty (change of) liver, not elsewhere classified: Secondary | ICD-10-CM

## 2024-05-31 DIAGNOSIS — H409 Unspecified glaucoma: Secondary | ICD-10-CM | POA: Insufficient documentation

## 2024-05-31 DIAGNOSIS — E785 Hyperlipidemia, unspecified: Secondary | ICD-10-CM

## 2024-05-31 MED ORDER — EPINEPHRINE 0.3 MG/0.3ML IJ SOAJ: 0.3000 mg | Freq: Once | INTRAMUSCULAR | 0 refills | Status: AC

## 2024-05-31 MED ORDER — AMLODIPINE BESYLATE 5 MG PO TABS: 5.0000 mg | ORAL_TABLET | Freq: Every day | ORAL | 4 refills | Status: AC

## 2024-05-31 MED ORDER — ROSUVASTATIN CALCIUM 10 MG PO TABS: 10.0000 mg | ORAL_TABLET | Freq: Every day | ORAL | 4 refills | Status: AC

## 2024-05-31 MED ORDER — WEGOVY 0.25 MG/0.5ML ~~LOC~~ SOAJ: 0.2500 mg | SUBCUTANEOUS | 0 refills | Status: DC

## 2024-05-31 MED ORDER — SILDENAFIL CITRATE 100 MG PO TABS: 50.0000 mg | ORAL_TABLET | Freq: Every day | ORAL | 5 refills | Status: AC | PRN

## 2024-05-31 MED ORDER — EPINEPHRINE 0.3 MG/0.3ML IJ SOAJ: 0.3000 mg | Freq: Once | INTRAMUSCULAR | 0 refills | Status: DC

## 2024-05-31 MED ORDER — MELOXICAM 15 MG PO TABS: 15.0000 mg | ORAL_TABLET | Freq: Every day | ORAL | 4 refills | Status: AC

## 2024-05-31 MED ORDER — TRIAMTERENE-HCTZ 37.5-25 MG PO CAPS: 1.0000 | ORAL_CAPSULE | ORAL | 4 refills | Status: AC

## 2024-05-31 NOTE — Telephone Encounter (Signed)
 Pharmacy Patient Advocate Encounter   Received notification from CoverMyMeds that prior authorization for Wegovy 0.25MG /0.5ML auto-injectors  is required/requested.   Insurance verification completed.   The patient is insured through [facility] .   Per test claim: PA required; PA submitted to above mentioned insurance via Latent Key/confirmation #/[id number] [facility] Status is pending

## 2024-05-31 NOTE — Assessment & Plan Note (Signed)
 Recently started on Crestor 10 mg daily.  Reviewed recent lipid panel which did show LDL of 108.  He is working on lifestyle modifications.  He is interested in a cardiac CT scan to look at calcium score.  Will order this today.  He is doing an excellent job with diet and exercise.  He will come back soon to recheck lipids.

## 2024-05-31 NOTE — Assessment & Plan Note (Signed)
 Following with ophthalmology.  Recent exam was stable.

## 2024-05-31 NOTE — Assessment & Plan Note (Signed)
 Patient diagnosed with fatty liver disease by his previous physician.  His previous physician tried starting him on Ozempic however insurance would not pay for this.  He is working on lifestyle interventions though it has been very difficult for him to lose weight.  He would be a good candidate for GLP agonist due to his BMI with comorbidities as well as his fatty liver disease.  Will start Wegovy 0.25 mg weekly.  Discussed potential side effects.  He will follow-up with us  in a few weeks and we can adjust the dose as needed.

## 2024-05-31 NOTE — Assessment & Plan Note (Signed)
 Patient with occasional reflux symptoms which is overall manageable.  He is currently on Pepcid  for GI protection and also for his anaphylactic episode as above.  He is getting this over-the-counter.

## 2024-05-31 NOTE — Assessment & Plan Note (Signed)
Stable on Allegra daily. 

## 2024-05-31 NOTE — Assessment & Plan Note (Signed)
 Patient had anaphylactic episode many years ago without clear etiology.  Did have allergic workup at that time which was unrevealing.  Typically carries EpiPen  on hand.  Will refill this today.

## 2024-05-31 NOTE — Assessment & Plan Note (Signed)
 Follows with orthopedics.  He is on meloxicam 15 mg daily.  Tolerating well.  Also on famotidine  20 mg daily for GI protection.  Will refill medications today.  Defer further management to orthopedics.

## 2024-05-31 NOTE — Assessment & Plan Note (Signed)
 Symptoms are currently not controlled.  He has been on Cialis most recently however seems to have lost his effectiveness.  He was previously on sildenafil and believes it may have been more effective.  We will try this again today.  We discussed potential side effects.  He can take 50 to 100 mg daily as needed.  He will come back in a few weeks and we can also check testosterone level at that point.

## 2024-05-31 NOTE — Progress Notes (Signed)
 Shawn Ruiz is a 73 y.o. male who presents today for an office visit.  He is here today to establish care.   Assessment/Plan:  Chronic Problems Addressed Today: Osteoarthritis Follows with orthopedics.  He is on meloxicam 15 mg daily.  Tolerating well.  Also on famotidine  20 mg daily for GI protection.  Will refill medications today.  Defer further management to orthopedics.  Essential hypertension Blood pressure at goal today on amlodipine 5 mg daily and triamterene-HCTZ 37.5-25 once daily.  He will come back in a few weeks and we can recheck labs at that time.  Anaphylaxis Patient had anaphylactic episode many years ago without clear etiology.  Did have allergic workup at that time which was unrevealing.  Typically carries EpiPen  on hand.  Will refill this today.  GERD (gastroesophageal reflux disease) Patient with occasional reflux symptoms which is overall manageable.  He is currently on Pepcid  for GI protection and also for his anaphylactic episode as above.  He is getting this over-the-counter.  Seasonal allergies Stable on Allegra daily.  Dyslipidemia Recently started on Crestor 10 mg daily.  Reviewed recent lipid panel which did show LDL of 108.  He is working on lifestyle modifications.  He is interested in a cardiac CT scan to look at calcium score.  Will order this today.  He is doing an excellent job with diet and exercise.  He will come back soon to recheck lipids.  Glaucoma Following with ophthalmology.  Macular degeneration Following with ophthalmology.  Recent exam was stable.  Lumbar spondylosis Continue management per this spinal specialist.  Has had multiple epidural steroid shots in the past with some benefit.  Erectile dysfunction Symptoms are currently not controlled.  He has been on Cialis most recently however seems to have lost his effectiveness.  He was previously on sildenafil and believes it may have been more effective.  We will try this again  today.  We discussed potential side effects.  He can take 50 to 100 mg daily as needed.  He will come back in a few weeks and we can also check testosterone level at that point.  Fatty liver Patient diagnosed with fatty liver disease by his previous physician.  His previous physician tried starting him on Ozempic however insurance would not pay for this.  He is working on lifestyle interventions though it has been very difficult for him to lose weight.  He would be a good candidate for GLP agonist due to his BMI with comorbidities as well as his fatty liver disease.  Will start Wegovy 0.25 mg weekly.  Discussed potential side effects.  He will follow-up with us  in a few weeks and we can adjust the dose as needed.  Preventative health care Obtain records from previous PCP.  He is up-to-date on vaccines and colon cancer screening-will need to update these records.    Subjective:  HPI:  See assessment / plan for status of chronic conditions.   Patient is here today to establish care.  No acute concerns today though does have a few chronic issues would like to discuss today.  His previous physician retired and he is establishing here.  Medical history is significant for essential hypertension which is controlled with amlodipine and Dyazide.  Also recently diagnosed with dyslipidemia and has been started on Crestor 10 mg daily.  He is tolerating this well.  He does note difficulty with losing weight for several years.  His previous physician tried starting him on Wegovy however insurance would  not pay for this.  He is very active and does a mix of weight training and cardio.  Exercises daily.  ROS: Per HPI, otherwise a complete review of systems was negative.   PMH:  The following were reviewed and entered/updated in epic: Past Medical History:  Diagnosis Date   Adenomatous colon polyp    Essential hypertension, benign    Fatty liver    Mixed hyperlipidemia    OA (osteoarthritis)     Prediabetes    Seasonal allergies    Patient Active Problem List   Diagnosis Date Noted   Osteoarthritis 05/31/2024   Essential hypertension 05/31/2024   Anaphylaxis 05/31/2024   GERD (gastroesophageal reflux disease) 05/31/2024   Seasonal allergies 05/31/2024   Dyslipidemia 05/31/2024   Glaucoma 05/31/2024   Macular degeneration 05/31/2024   Dupuytren contracture 05/31/2024   Lumbar spondylosis 05/31/2024   Erectile dysfunction 05/31/2024   Fatty liver 05/31/2024   Past Surgical History:  Procedure Laterality Date   KNEE SURGERY Left    torn meniscus   ROTATOR CUFF REPAIR Right    2001   TOTAL HIP ARTHROPLASTY Right     Family History  Problem Relation Age of Onset   CAD Father    Ulcers Mother    Hypertension Mother    CAD Mother    Congestive Heart Failure Mother     Medications- reviewed and updated Current Outpatient Medications  Medication Sig Dispense Refill   semaglutide-weight management (WEGOVY) 0.25 MG/0.5ML SOAJ SQ injection Inject 0.25 mg into the skin once a week. 2 mL 0   sildenafil (VIAGRA) 100 MG tablet Take 0.5-1 tablets (50-100 mg total) by mouth daily as needed for erectile dysfunction. 30 tablet 5   ALPHAGAN P 0.1 % SOLN Apply to eye.     amLODipine (NORVASC) 5 MG tablet Take 1 tablet (5 mg total) by mouth daily. 90 tablet 4   EPINEPHrine  0.3 mg/0.3 mL IJ SOAJ injection Inject 0.3 mg into the muscle once for 1 dose. 2 each 0   famotidine  (PEPCID ) 20 MG tablet Take 20 mg by mouth daily.     fexofenadine (ALLEGRA) 60 MG tablet Take 60 mg by mouth daily as needed.     meloxicam (MOBIC) 15 MG tablet Take 1 tablet (15 mg total) by mouth daily. 90 tablet 4   Multiple Vitamin (MULTIVITAMIN) tablet Take 1 tablet by mouth daily.     rosuvastatin (CRESTOR) 10 MG tablet Take 1 tablet (10 mg total) by mouth at bedtime. 90 tablet 4   triamterene-hydrochlorothiazide (DYAZIDE) 37.5-25 MG capsule Take 1 each (1 capsule total) by mouth every morning. 90 capsule 4    No current facility-administered medications for this visit.    Allergies-reviewed and updated Allergies  Allergen Reactions   Penicillins     Childhood, reaction unknown    Social History   Socioeconomic History   Marital status: Married    Spouse name: carolyn   Number of children: 4   Years of education: PhD   Highest education level: Not on file  Occupational History    Comment: [employer]  Tobacco Use   Smoking status: Never   Smokeless tobacco: Never  Substance and Sexual Activity   Alcohol use: Yes    Comment: wine with dinner 4-5 nights a week   Drug use: No   Sexual activity: Not on file  Other Topics Concern   Not on file  Social History Narrative   Lives with wife   Social Drivers of Health  Financial Resource Strain: Not on file  Food Insecurity: Low Risk  (12/02/2023)   Received from [facility]   Hunger Vital Sign    Within the past 12 months, you worried that your food would run out before you got money to buy more: Never true    Within the past 12 months, the food you bought just didn't last and you didn't have money to get more. : Never true  Transportation Needs: No Transportation Needs (12/02/2023)   Received from [facility]    In the past 12 months, has lack of reliable transportation kept you from medical appointments, meetings, work or from getting things needed for daily living? : No  Physical Activity: Not on file  Stress: Not on file  Social Connections: Unknown (01/14/2022)   Received from [facility]   Social Network    Social Network: Not on file         Objective:  Physical Exam: BP 118/67   Pulse 82   Temp 97.9 F (36.6 C) (Temporal)   Ht 5' 6 (1.676 m)   Wt 200 lb 9.6 oz (91 kg)   SpO2 99%   BMI 32.38 kg/m   Gen: No acute distress, resting comfortably CV: Regular rate and rhythm with no murmurs appreciated Pulm: Normal work of breathing, clear to auscultation bilaterally with no  crackles, wheezes, or rhonchi Neuro: Grossly normal, moves all extremities Psych: Normal affect and thought content  Time Spent: 62 minutes of total time was spent on the date of the encounter performing the following actions: chart review prior to seeing the patient including recent visits with specialists, obtaining history, reviewing previous PCP notes, performing a medically necessary exam, counseling on the treatment plan, placing orders, and documenting in our EHR.        Worth HERO. Kennyth, MD 05/31/2024 12:07 PM

## 2024-05-31 NOTE — Assessment & Plan Note (Signed)
Following with ophthalmology. 

## 2024-05-31 NOTE — Assessment & Plan Note (Signed)
 Continue management per this spinal specialist.  Has had multiple epidural steroid shots in the past with some benefit.

## 2024-05-31 NOTE — Assessment & Plan Note (Signed)
 Blood pressure at goal today on amlodipine 5 mg daily and triamterene-HCTZ 37.5-25 once daily.  He will come back in a few weeks and we can recheck labs at that time.

## 2024-05-31 NOTE — Patient Instructions (Addendum)
 It was very nice to see you today!  I will refill all of your medications today.  We will order a cardiac CT scan to look for calcifications in your heart.  I will send a prescription in for [facility].  I hope your insurance will pay for this for your fatty liver disease.  We will start 0.25 mg weekly for 4 weeks and then increase to 0.5 mg weekly if you are tolerating this well.  Sending message in a few weeks to MyChart to let me know how you are doing with this.  I will send a prescription in for sildenafil 100 mg daily.  You can also let us  know how this is working for you in a couple of weeks.  Will see you back in a few weeks to recheck labs.  Come back sooner if needed.  Return in about 2 weeks (around 06/14/2024) for Follow Up.   Take care, Dr Kennyth  PLEASE NOTE:  If you had any lab tests, please let us  know if you have not heard back within a few days. You may see your results on mychart before we have a chance to review them but we will give you a call once they are reviewed by us .   If we ordered any referrals today, please let us  know if you have not heard from their office within the next week.   If you had any urgent prescriptions sent in today, please check with the pharmacy within an hour of our visit to make sure the prescription was transmitted appropriately.   Please try these tips to maintain a healthy lifestyle:  Eat at least 3 REAL meals and 1-2 snacks per day.  Aim for no more than 5 hours between eating.  If you eat breakfast, please do so within one hour of getting up.   Each meal should contain half fruits/vegetables, one quarter protein, and one quarter carbs (no bigger than a computer mouse)  Cut down on sweet beverages. This includes juice, soda, and sweet tea.   Drink at least 1 glass of water with each meal and aim for at least 8 glasses per day  Exercise at least 150 minutes every week.

## 2024-06-01 ENCOUNTER — Other Ambulatory Visit (HOSPITAL_COMMUNITY): Payer: Self-pay

## 2024-06-02 NOTE — Telephone Encounter (Signed)
 Pharmacy Patient Advocate Encounter  Received notification from [employer] that Prior Authorization for [facility] 0.25MG /0.5ML auto-injectors  has been DENIED.  See denial reason below. No denial letter attached in CMM. Will attach denial letter to Media tab once received. The requested medication and/or diagnosis are not a covered benefit and excluded from coverage in accordance with the terms and conditions of your plan benefit. Therefore, the request has been administratively denied.     PA #/Case ID/Reference #: [id number]

## 2024-06-04 NOTE — Telephone Encounter (Signed)
 Please let patient know insurance declined his 32.  We have not received the letter as of yet however he can try calling his insurance company to see if they have any other covered alternatives.

## 2024-06-07 ENCOUNTER — Ambulatory Visit (HOSPITAL_COMMUNITY)
Admission: RE | Admit: 2024-06-07 | Discharge: 2024-06-07 | Disposition: A | Discharge status: Home / Self Care | Payer: Self-pay | Source: Ambulatory Visit | Attending: Internal Medicine | Admitting: Internal Medicine

## 2024-06-07 ENCOUNTER — Other Ambulatory Visit (HOSPITAL_COMMUNITY): Payer: Self-pay

## 2024-06-07 DIAGNOSIS — E785 Hyperlipidemia, unspecified: Secondary | ICD-10-CM | POA: Insufficient documentation

## 2024-06-07 DIAGNOSIS — I77810 Thoracic aortic ectasia: Secondary | ICD-10-CM | POA: Insufficient documentation

## 2024-06-07 NOTE — Telephone Encounter (Signed)
 Patient aware Rx Wegovy denied by insurance  Advise to contact insurance for alternatives

## 2024-06-08 ENCOUNTER — Ambulatory Visit: Payer: Self-pay | Admitting: Family Medicine

## 2024-06-08 NOTE — Progress Notes (Signed)
 His cardiac CT scan shows that he has a moderate amount of calcium in the arteries in his heart.  This is slightly above average for his age at the 72nd percentile.  This is not an urgent concern however it is probably a good idea for him to see a cardiologist to discuss cardiovascular risk reduction.  Please place referral to cardiology if he is agreeable.  We can also discuss at his visit with me next week.

## 2024-06-11 ENCOUNTER — Encounter: Payer: Self-pay | Admitting: Family Medicine

## 2024-06-11 ENCOUNTER — Other Ambulatory Visit: Payer: Self-pay | Admitting: *Deleted

## 2024-06-11 DIAGNOSIS — R931 Abnormal findings on diagnostic imaging of heart and coronary circulation: Secondary | ICD-10-CM

## 2024-06-11 NOTE — Telephone Encounter (Signed)
 Cardiology referral placed.

## 2024-06-16 ENCOUNTER — Ambulatory Visit (INDEPENDENT_AMBULATORY_CARE_PROVIDER_SITE_OTHER): Admitting: Family Medicine

## 2024-06-16 ENCOUNTER — Encounter: Payer: Self-pay | Admitting: Family Medicine

## 2024-06-16 VITALS — BP 118/70 | HR 94 | Temp 97.6°F | Ht 66.0 in | Wt 204.6 lb

## 2024-06-16 DIAGNOSIS — L219 Seborrheic dermatitis, unspecified: Secondary | ICD-10-CM | POA: Insufficient documentation

## 2024-06-16 DIAGNOSIS — I10 Essential (primary) hypertension: Secondary | ICD-10-CM | POA: Diagnosis not present

## 2024-06-16 DIAGNOSIS — E785 Hyperlipidemia, unspecified: Secondary | ICD-10-CM | POA: Diagnosis not present

## 2024-06-16 DIAGNOSIS — K219 Gastro-esophageal reflux disease without esophagitis: Secondary | ICD-10-CM | POA: Diagnosis not present

## 2024-06-16 DIAGNOSIS — K760 Fatty (change of) liver, not elsewhere classified: Secondary | ICD-10-CM | POA: Diagnosis not present

## 2024-06-16 DIAGNOSIS — Z125 Encounter for screening for malignant neoplasm of prostate: Secondary | ICD-10-CM | POA: Diagnosis not present

## 2024-06-16 LAB — COMPREHENSIVE METABOLIC PANEL WITH GFR
ALT: 38 U/L (ref 0–53)
AST: 45 U/L — ABNORMAL HIGH (ref 0–37)
Albumin: 4.5 g/dL (ref 3.5–5.2)
Alkaline Phosphatase: 42 U/L (ref 39–117)
BUN: 27 mg/dL — ABNORMAL HIGH (ref 6–23)
CO2: 26 meq/L (ref 19–32)
Calcium: 9.2 mg/dL (ref 8.4–10.5)
Chloride: 103 meq/L (ref 96–112)
Creatinine, Ser: 1.22 mg/dL (ref 0.40–1.50)
GFR: 59.01 mL/min — ABNORMAL LOW (ref >60.00)
Glucose, Bld: 133 mg/dL — ABNORMAL HIGH (ref 70–99)
Potassium: 4.3 meq/L (ref 3.5–5.1)
Sodium: 138 meq/L (ref 135–145)
Total Bilirubin: 0.5 mg/dL (ref 0.2–1.2)
Total Protein: 7.1 g/dL (ref 6.0–8.3)

## 2024-06-16 LAB — CBC
HCT: 31.5 % — ABNORMAL LOW (ref 39.0–52.0)
Hemoglobin: 9.9 g/dL — ABNORMAL LOW (ref 13.0–17.0)
MCHC: 31.3 g/dL (ref 30.0–36.0)
MCV: 83.5 fl (ref 78.0–100.0)
Platelets: 194 K/uL (ref 150.0–400.0)
RBC: 3.78 Mil/uL — ABNORMAL LOW (ref 4.22–5.81)
RDW: 16.1 % — ABNORMAL HIGH (ref 11.5–15.5)
WBC: 3.4 K/uL — ABNORMAL LOW (ref 4.0–10.5)

## 2024-06-16 LAB — LIPID PANEL
Cholesterol: 147 mg/dL (ref 0–200)
HDL: 50.7 mg/dL (ref >39.00)
LDL Cholesterol: 48 mg/dL (ref 0–99)
NonHDL: 96.24
Total CHOL/HDL Ratio: 3
Triglycerides: 240 mg/dL — ABNORMAL HIGH (ref 0.0–149.0)
VLDL: 48 mg/dL — ABNORMAL HIGH (ref 0.0–40.0)

## 2024-06-16 LAB — TSH: TSH: 0.95 u[IU]/mL (ref 0.35–5.50)

## 2024-06-16 LAB — HEMOGLOBIN A1C: Hgb A1c MFr Bld: 5.6 % (ref 4.6–6.5)

## 2024-06-16 LAB — PSA: PSA: 2.37 ng/mL (ref 0.10–4.00)

## 2024-06-16 MED ORDER — KETOCONAZOLE 2 % EX CREA: 1.0000 | TOPICAL_CREAM | Freq: Two times a day (BID) | CUTANEOUS | 0 refills | Status: DC

## 2024-06-16 MED ORDER — KETOCONAZOLE 2 % EX SHAM: 1.0000 | MEDICATED_SHAMPOO | CUTANEOUS | 5 refills | Status: AC

## 2024-06-16 NOTE — Progress Notes (Signed)
   Shawn Ruiz is a 73 y.o. male who presents today for an office visit.  Assessment/Plan:  Chronic Problems Addressed Today: Essential hypertension Blood pressure at goal today on amlodipine 5 mg daily and triamterene hydrochlorothiazide 37.5 - 25 once daily.  Check labs.  GERD (gastroesophageal reflux disease) Stable on Pepcid .   Fatty liver Check LFTs with labs.  He has been diagnosed with fatty liver disease and would benefit from GLP agonist however insurance would not approve this.  We will check on his prior authorization to see if we can get this covered for him.  Dyslipidemia He is currently on Crestor 10 mg daily.  Tolerating well.  Will check labs today.  Discussed recent cardiac CT scan which showed 72nd percentile.  We discussed aggressive management for his lipids and we referred him to cardiology last week.  He will call to schedule appointment soon.  Seborrheic dermatitis Rash on face consistent with seborrheic dermatitis.  Will start ketoconazole shampoo.  He will let us  know if not improving.     Subjective:  HPI:  See assessment / plan for status of chronic conditions.   Discussed the use of AI scribe software for clinical note transcription with the patient, who gave verbal consent to proceed.  History of Present Illness Shawn Ruiz is a 73 year old male with prediabetes, high blood pressure, and a high BMI who presents for follow-up on cardiac CT scan results and medication issues.  He is here for follow-up on his cardiac CT scan results. He is concerned about these findings and is seeking guidance on next steps.  He has been denied a prescription for [facility] despite his belief that he meets the criteria for its use, given his prediabetes, high BMI, and high blood pressure. He exercises regularly but struggles with weight loss. He is awaiting further information on the denial and is interested in pursuing options to address this issue.  Recently, he has  developed a rash on his face, which he describes as possibly related to trimming his beard but suspects it is more of a rash. The rash is itchy and occasionally flakes. He has not used any specific treatment for it yet.  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 He has had moles removed in the past and is vigilant about monitoring his skin for changes.  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 He has a history of colonoscopies every five years, with the last one being about a year and a half ago, showing good results. He is not due for another for a few more years.         Objective:  Physical Exam: BP 118/70   Pulse 94   Temp 97.6 F (36.4 C) (Temporal)   Ht 5' 6 (1.676 m)   Wt 204 lb 9.6 oz (92.8 kg)   SpO2 97%   BMI 33.02 kg/m   Gen: No acute distress, resting comfortably CV: Regular rate and rhythm with no murmurs appreciated Pulm: Normal work of breathing, clear to auscultation bilaterally with no crackles, wheezes, or rhonchi Skin: seborrheic dermatitis noted along upper lip bilaterally6 Neuro: Grossly normal, moves all extremities Psych: Normal affect and thought content      Natahlia Hoggard M. Kennyth, MD 06/16/2024 9:51 AM

## 2024-06-16 NOTE — Patient Instructions (Addendum)
 It was very nice to see you today!  VISIT SUMMARY: During your visit, we discussed the results of your cardiac CT scan, addressed your concerns about the denial of Wegovy for weight management, and examined a rash on your face.  YOUR PLAN: CORONARY ARTERY CALCIFICATION AND CARDIOVASCULAR RISK MANAGEMENT: You have mild to moderate coronary artery calcification, which requires aggressive management to reduce your cardiovascular risk. -We aim to keep your LDL cholesterol level below 70. We will order cholesterol levels to assess your LDL. -You will be referred to a cardiologist for further evaluation and potential additional testing. Please contact the cardiologist to schedule an appointment.  [phone]  OBESITY WITH PREDIABETES AND HYPERTENSION: Your prescription for Georjean was denied despite meeting the criteria. We are working on addressing this issue. -We have sent a message to the prior authorization team to address the [facility] denial. -We will obtain updated blood work to assess your A1c levels and liver function.  SEBORRHEIC DERMATITIS OF FACE: You have a rash on your face that is consistent with seborrheic dermatitis, likely exacerbated by facial hair and oil glands. -We have prescribed ketoconazole shampoo for daily use until the rash resolves. -After the rash resolves, use ketoconazole shampoo as needed or once/twice weekly for maintenance.  Return in about 6 months (around 12/15/2024) for Follow Up.   Take care, Dr Kennyth  PLEASE NOTE:  If you had any lab tests, please let us  know if you have not heard back within a few days. You may see your results on mychart before we have a chance to review them but we will give you a call once they are reviewed by us .   If we ordered any referrals today, please let us  know if you have not heard from their office within the next week.   If you had any urgent prescriptions sent in today, please check with the pharmacy within an hour of our  visit to make sure the prescription was transmitted appropriately.   Please try these tips to maintain a healthy lifestyle:  Eat at least 3 REAL meals and 1-2 snacks per day.  Aim for no more than 5 hours between eating.  If you eat breakfast, please do so within one hour of getting up.   Each meal should contain half fruits/vegetables, one quarter protein, and one quarter carbs (no bigger than a computer mouse)  Cut down on sweet beverages. This includes juice, soda, and sweet tea.   Drink at least 1 glass of water with each meal and aim for at least 8 glasses per day  Exercise at least 150 minutes every week.

## 2024-06-16 NOTE — Assessment & Plan Note (Signed)
 He is currently on Crestor 10 mg daily.  Tolerating well.  Will check labs today.  Discussed recent cardiac CT scan which showed 72nd percentile.  We discussed aggressive management for his lipids and we referred him to cardiology last week.  He will call to schedule appointment soon.

## 2024-06-16 NOTE — Assessment & Plan Note (Signed)
 Check LFTs with labs.  He has been diagnosed with fatty liver disease and would benefit from GLP agonist however insurance would not approve this.  We will check on his prior authorization to see if we can get this covered for him.

## 2024-06-16 NOTE — Assessment & Plan Note (Signed)
 Rash on face consistent with seborrheic dermatitis.  Will start ketoconazole shampoo.  He will let us  know if not improving.

## 2024-06-16 NOTE — Assessment & Plan Note (Signed)
 Blood pressure at goal today on amlodipine 5 mg daily and triamterene hydrochlorothiazide 37.5 - 25 once daily.  Check labs.

## 2024-06-16 NOTE — Assessment & Plan Note (Signed)
 Stable on Pepcid.

## 2024-06-18 ENCOUNTER — Ambulatory Visit: Payer: Self-pay | Admitting: Family Medicine

## 2024-06-18 DIAGNOSIS — R7989 Other specified abnormal findings of blood chemistry: Secondary | ICD-10-CM

## 2024-06-18 NOTE — Progress Notes (Signed)
 His blood counts have dropped quite a bit since the last number have today compared to which was 6 months ago. I would like for him to come back to check folate, B12, and iron panel as well as repeat CBC.  Please place future orders.  He can come here next week to have these done.  His labs also may indicate that he is a little dehydrated.  Recommend he increase fluid intake and we can recheck CMET next week as well.   His LDL is under excellent control at 48.  He can continue with the Crestor 10 mg daily.  Triglycerides are mildly elevated however do not need to make any adjustments to his medication or treatment plan at this time.  We can recheck this again in 6 months.  All of his other labs are at goal and we can recheck everything in 6 months.

## 2024-06-25 ENCOUNTER — Ambulatory Visit: Admitting: Family Medicine

## 2024-07-09 ENCOUNTER — Other Ambulatory Visit (INDEPENDENT_AMBULATORY_CARE_PROVIDER_SITE_OTHER)

## 2024-07-09 DIAGNOSIS — R7989 Other specified abnormal findings of blood chemistry: Secondary | ICD-10-CM

## 2024-07-09 LAB — CBC WITH DIFFERENTIAL/PLATELET
Basophils Absolute: 0 K/uL (ref 0.0–0.1)
Basophils Relative: 1.2 % (ref 0.0–3.0)
Eosinophils Absolute: 0.2 K/uL (ref 0.0–0.7)
Eosinophils Relative: 4 % (ref 0.0–5.0)
HCT: 32 % — ABNORMAL LOW (ref 39.0–52.0)
Hemoglobin: 10.1 g/dL — ABNORMAL LOW (ref 13.0–17.0)
Lymphocytes Relative: 24 % (ref 12.0–46.0)
Lymphs Abs: 0.9 K/uL (ref 0.7–4.0)
MCHC: 31.6 g/dL (ref 30.0–36.0)
MCV: 80.6 fl (ref 78.0–100.0)
Monocytes Absolute: 0.3 K/uL (ref 0.1–1.0)
Monocytes Relative: 8.4 % (ref 3.0–12.0)
Neutro Abs: 2.5 K/uL (ref 1.4–7.7)
Neutrophils Relative %: 62.4 % (ref 43.0–77.0)
Platelets: 194 K/uL (ref 150.0–400.0)
RBC: 3.97 Mil/uL — ABNORMAL LOW (ref 4.22–5.81)
RDW: 15.2 % (ref 11.5–15.5)
WBC: 4 K/uL (ref 4.0–10.5)

## 2024-07-09 LAB — COMPREHENSIVE METABOLIC PANEL WITH GFR
ALT: 32 U/L (ref 0–53)
AST: 33 U/L (ref 0–37)
Albumin: 4.4 g/dL (ref 3.5–5.2)
Alkaline Phosphatase: 45 U/L (ref 39–117)
BUN: 20 mg/dL (ref 6–23)
CO2: 28 meq/L (ref 19–32)
Calcium: 9.1 mg/dL (ref 8.4–10.5)
Chloride: 104 meq/L (ref 96–112)
Creatinine, Ser: 1.04 mg/dL (ref 0.40–1.50)
GFR: 71.43 mL/min (ref >60.00)
Glucose, Bld: 108 mg/dL — ABNORMAL HIGH (ref 70–99)
Potassium: 4.6 meq/L (ref 3.5–5.1)
Sodium: 142 meq/L (ref 135–145)
Total Bilirubin: 0.5 mg/dL (ref 0.2–1.2)
Total Protein: 7.3 g/dL (ref 6.0–8.3)

## 2024-07-09 LAB — VITAMIN B12: Vitamin B-12: 600 pg/mL (ref 211–911)

## 2024-07-10 LAB — IRON,TIBC AND FERRITIN PANEL
%SAT: 4 % — ABNORMAL LOW (ref 20–48)
Ferritin: 5 ng/mL — ABNORMAL LOW (ref 24–380)
Iron: 20 ug/dL — ABNORMAL LOW (ref 50–180)
TIBC: 474 ug/dL — ABNORMAL HIGH (ref 250–425)

## 2024-07-12 ENCOUNTER — Ambulatory Visit: Payer: Self-pay | Admitting: Family Medicine

## 2024-07-12 DIAGNOSIS — E611 Iron deficiency: Secondary | ICD-10-CM

## 2024-07-12 NOTE — Progress Notes (Signed)
 His kidney numbers have improved since the last time that we checked.  He should continue to make sure that he is staying well-hydrated and we can recheck at his next office visit here.  His iron levels are low.  This is usually due to slow bleeding in the GI tract.  It would be a good idea for us  to refer him to see a gastroenterologist to further evaluate for this.  Please place referral if he is agreeable.  He should start ferrous sulfate 65 mg every other day on empty stomach.  We can recheck his iron levels in 3 to 6 months.

## 2024-07-19 ENCOUNTER — Ambulatory Visit: Payer: Self-pay | Admitting: Family Medicine

## 2024-07-19 NOTE — Progress Notes (Signed)
 Looks like his most recent colonoscopy was abstracted into the chart but he still needs complete gastroenterology work up. It is ok for him to go back to [facility] gastroenterology or we can have him follow up with [facility], but it is very important that we have this scheduled soon.

## 2024-07-26 ENCOUNTER — Encounter: Payer: Self-pay | Admitting: *Deleted

## 2024-07-27 ENCOUNTER — Ambulatory Visit: Attending: Internal Medicine | Admitting: Internal Medicine

## 2024-07-27 ENCOUNTER — Encounter: Payer: Self-pay | Admitting: Internal Medicine

## 2024-07-27 VITALS — BP 116/58 | HR 82 | Ht 66.0 in | Wt 209.1 lb

## 2024-07-27 DIAGNOSIS — I2510 Atherosclerotic heart disease of native coronary artery without angina pectoris: Secondary | ICD-10-CM | POA: Diagnosis not present

## 2024-07-27 DIAGNOSIS — E782 Mixed hyperlipidemia: Secondary | ICD-10-CM | POA: Diagnosis not present

## 2024-07-27 DIAGNOSIS — I10 Essential (primary) hypertension: Secondary | ICD-10-CM

## 2024-07-27 DIAGNOSIS — R931 Abnormal findings on diagnostic imaging of heart and coronary circulation: Secondary | ICD-10-CM | POA: Diagnosis not present

## 2024-07-27 DIAGNOSIS — K760 Fatty (change of) liver, not elsewhere classified: Secondary | ICD-10-CM

## 2024-07-27 MED ORDER — ASPIRIN 81 MG PO TBEC
81.0000 mg | DELAYED_RELEASE_TABLET | Freq: Every day | ORAL | Status: AC
Start: 2024-07-27 — End: ?

## 2024-07-27 MED ORDER — METOPROLOL TARTRATE 50 MG PO TABS: 50.0000 mg | ORAL_TABLET | Freq: Once | ORAL | 0 refills | Status: DC

## 2024-07-27 NOTE — Progress Notes (Signed)
 Cardiology Office Note   Date:  07/27/2024  ID:  Shawn Ruiz, DOB 1950/09/17, MRN [id number] PCP: Shawn Worth HERO, MD  [facility] Providers Cardiologist:  Emeline FORBES Calender, MD     History of Present Illness Shawn Ruiz is a 73 y.o. male with a past medical history of elevated coronary calcium  score (619 - 06/07/2024), dilated ascending aorta (42 mm on CT), GERD, dyslipidemia, fatty liver, hypertension, glaucoma who was referred by his PCP due to elevated coronary calcium  score.  He was seen by his PCP on 05/31/2024 and a coronary calcium  score was ordered at the interest of the patient.    Coronary calcium  score came back elevated at 619 prompting him to be referred today.  He states that he is able to exercise frequently and plays pickle ball.  He has noted exertional shortness of breath when going up about 2 flights of stairs but does not get any chest pain or tightness with this.  Has a family history of premature CAD in his father in his 90s and mom had CAD in her later years.  He does not have any other cardiac history that he knows of.  Tobacco use: Never Alcohol use: 1 glass of wine per night Activity level: Plays pickle ball and exercises Diet mainly consists of: Small breakfast, skips lunch, big dinner    ROS:  Review of Systems  All other systems reviewed and are negative.   Physical Exam  Physical Exam Vitals and nursing note reviewed.  Constitutional:      Appearance: Normal appearance.  HENT:     Head: Normocephalic and atraumatic.  Eyes:     Conjunctiva/sclera: Conjunctivae normal.  Neck:     Vascular: No carotid bruit.  Cardiovascular:     Rate and Rhythm: Normal rate and regular rhythm.  Pulmonary:     Effort: Pulmonary effort is normal.     Breath sounds: Normal breath sounds.  Musculoskeletal:        General: No swelling or tenderness.  Skin:    Coloration: Skin is not jaundiced or pale.  Neurological:     Mental Status: He is alert.      VS:  BP (!) 116/58   Pulse 82   Ht 5' 6 (1.676 m)   Wt 209 lb 1.6 oz (94.8 kg)   SpO2 97%   BMI 33.75 kg/m         Wt Readings from Last 3 Encounters:  07/27/24 209 lb 1.6 oz (94.8 kg)  06/16/24 204 lb 9.6 oz (92.8 kg)  05/31/24 200 lb 9.6 oz (91 kg)     EKG Interpretation Date/Time:  Tuesday July 27 2024 08:13:10 EST Ventricular Rate:  82 PR Interval:  158 QRS Duration:  98 QT Interval:  398 QTC Calculation: 464 R Axis:   -16  Text Interpretation: Normal sinus rhythm Minimal voltage criteria for LVH, may be normal variant ( R in aVL ) Cannot rule out Anterior infarct , age undetermined When compared with ECG of 13-Aug-2013 16:40, No significant change was found Confirmed by Calender Emeline [phone]) on 07/27/2024 8:20:43 AM    Studies Reviewed   Coronary calcium  score 06/07/2024: Coronary Calcium  Score: Left main: 126 Left anterior descending artery: 31 Left circumflex artery: 430 Right coronary artery: 33     Risk Assessment/Calculations             ASCVD risk score: The 10-year ASCVD risk score (Arnett DK, et al., 2019) is: 19.1%   Values used to  calculate the score:     Age: 7 years     Clincally relevant sex: Male     Is Non-Hispanic African American: No     Diabetic: No     Tobacco smoker: No     Systolic Blood Pressure: 116 mmHg     Is BP treated: Yes     HDL Cholesterol: 50.7 mg/dL     Total Cholesterol: 147 mg/dL   ASSESSMENT  Elevated coronary calcium  score score: 619 which puts him at the 72nd percentile.  He is already taking aspirin  81 mg daily and rosuvastatin  10 mg.  His LDL most recently was 48. Dilated ascending aorta noted by CT 42 mm on CT Shortness of breath with moderate exertion Dyslipidemia Hypertension Fatty liver Glaucoma Obesity  Plan  Continue aspirin  81 mg and rosuvastatin  Will check an LP(a) Echocardiogram Coronary CTA with beta-blocker prior He has tried to lose weight and has been denied GLP-1's by  insurance.  He was advised to change the bulk of his caloric intake to the morning and afternoon rather than the evening prior to sleeping. Cardiac risk counseling and prevention recommendations: Heart healthy/Mediterranean diet with whole grains, fruits, vegetable, fish, lean meats, nuts, and olive oil. Limit salt. Moderate walking, 3-5 times/week for 30-50 minutes each session. Aim for at least 150 minutes.week. Goal should be pace of 3 miles/hour, or walking 1.5 miles in 30 minutes Avoidance of tobacco products. Avoid excess alcohol.  Follow up: Pending above workup.  If unremarkable then can follow-up in 1 year.          Signed, Emeline FORBES Calender, MD

## 2024-07-27 NOTE — Patient Instructions (Addendum)
 Medication Instructions:    Continue taking Aspirin  81 mg daily   *If you need a refill on your cardiac medications before your next appointment, please call your pharmacy*   Lab Work: [facility]Pa If you have labs (blood work) drawn today and your tests are completely normal, you will receive your results only by: MyChart Message (if you have MyChart) OR A paper copy in the mail If you have any lab test that is abnormal or we need to change your treatment, we will call you to review the results.   Testing/Procedures: 1)Your physician has requested that you have an echocardiogram. Echocardiography is a painless test that uses sound waves to create images of your heart. It provides your doctor with information about the size and shape of your heart and how well your heart's chambers and valves are working. This procedure takes approximately one hour. There are no restrictions for this procedure. Please do NOT wear cologne, perfume, aftershave, or lotions (deodorant is allowed). Please arrive 15 minutes prior to your appointment time.  Please note: We ask at that you not bring children with you during ultrasound (echo/ vascular) testing. Due to room size and safety concerns, children are not allowed in the ultrasound rooms during exams. Our front office staff cannot provide observation of children in our lobby area while testing is being conducted. An adult accompanying a patient to their appointment will only be allowed in the ultrasound room at the discretion of the ultrasound technician under special circumstances. We apologize for any inconvenience.  2) Your physician has requested that you have coronary  CTA. Coronary computed tomography (CT)angiogram  is a special type of CT scan that uses a computer to produce multi-dimensional views of major blood vessels throughout the heart.  CT angiography, a contrast material is injected through an IV to help visualize the blood vessels  a painless test that  uses an x-ray machine to take clear, detailed pictures of your heart arteries .  Please follow instruction sheet as given.   Follow-Up: At [facility], you and your health needs are our priority.  As part of our continuing mission to provide you with exceptional heart care, we have created designated Provider Care Teams.  These Care Teams include your primary Cardiologist (physician) and Advanced Practice Providers (APPs -  Physician Assistants and Nurse Practitioners) who all work together to provide you with the care you need, when you need it.     Your next appointment:   Depending on test results    The format for your next appointment:   In Person  Provider:   Emeline FORBES Calender, MD   Other Instructions      Your cardiac CT will be scheduled at  the below locations:     [facility] [address]  [place], [place] [address]      If scheduled at the [facility] at [address], please enter the parking lot using the [address] entrance and use the FREE valet service at the patient drop-off area. Enter the building and check-in with registration on the main floor.    Please follow these instructions carefully (unless otherwise directed):  An IV will be required for this test and Nitroglycerin will be given.  Hold all erectile dysfunction medications at least 3 days (72 hrs) prior to test. (Ie viagra , cialis, sildenafil , tadalafil, etc)   On the Night Before the Test: Be sure to Drink plenty of water. Do not  consume any caffeinated/decaffeinated beverages or chocolate 12 hours prior to your test. Do not take any antihistamines 12 hours prior to your test.    On the Day of the Test: Drink plenty of water until 1 hour prior to the test. Do not eat any food 1 hour prior to test. You may take your regular medications prior to the test.  Take metoprolol  (Lopressor ) 50 mg two hours prior to test. If you take  Triamterene -Hydrochlorothiazide, please HOLD on the morning of the test.    After the Test: Drink plenty of water. After receiving IV contrast, you may experience a mild flushed feeling. This is normal. On occasion, you may experience a mild rash up to 24 hours after the test. This is not dangerous. If this occurs, you can take Benadryl  25 mg, Zyrtec, Claritin, or Allegra and increase your fluid intake. (Patients taking Tikosyn should avoid Benadryl , and may take Zyrtec, Claritin, or Allegra) If you experience trouble breathing, this can be serious. If it is severe call 911 IMMEDIATELY. If it is mild, please call our office.  We will call to schedule your test 2-4 weeks out understanding that some insurance companies will need an authorization prior to the service being performed.   For more information and frequently asked questions, please visit our website : [email or web address]  For non-scheduling related questions, please contact the cardiac imaging nurse navigator should you have any questions/concerns: Cardiac Imaging Nurse Navigators Direct Office Dial: [phone]   For scheduling needs, including cancellations and rescheduling, please call Brittany, [phone].

## 2024-07-29 LAB — LIPOPROTEIN A (LPA): Lipoprotein (a): 62.4 nmol/L (ref <75.0)

## 2024-08-02 ENCOUNTER — Ambulatory Visit: Payer: Self-pay | Admitting: Internal Medicine

## 2024-08-17 ENCOUNTER — Encounter (HOSPITAL_COMMUNITY): Payer: Self-pay

## 2024-08-19 ENCOUNTER — Ambulatory Visit (HOSPITAL_BASED_OUTPATIENT_CLINIC_OR_DEPARTMENT_OTHER)
Admission: RE | Admit: 2024-08-19 | Discharge: 2024-08-19 | Disposition: A | Source: Ambulatory Visit | Attending: Cardiovascular Disease | Admitting: Cardiovascular Disease

## 2024-08-19 ENCOUNTER — Other Ambulatory Visit: Payer: Self-pay | Admitting: Cardiovascular Disease

## 2024-08-19 ENCOUNTER — Ambulatory Visit (HOSPITAL_COMMUNITY): Admission: RE | Admit: 2024-08-19 | Attending: Internal Medicine

## 2024-08-19 DIAGNOSIS — R931 Abnormal findings on diagnostic imaging of heart and coronary circulation: Secondary | ICD-10-CM

## 2024-08-19 MED ORDER — NITROGLYCERIN 0.4 MG SL SUBL
0.8000 mg | SUBLINGUAL_TABLET | Freq: Once | SUBLINGUAL | Status: AC
  Administered 2024-08-19: 16:00:00 0.8 mg via SUBLINGUAL

## 2024-08-19 MED ORDER — IOHEXOL 350 MG/ML SOLN
100.0000 mL | Freq: Once | INTRAVENOUS | Status: AC | PRN
  Administered 2024-08-19: 16:00:00 100 mL via INTRAVENOUS

## 2024-08-20 ENCOUNTER — Ambulatory Visit: Payer: Self-pay | Admitting: Internal Medicine

## 2024-08-28 ENCOUNTER — Encounter: Payer: Self-pay | Admitting: Family Medicine

## 2024-08-30 ENCOUNTER — Other Ambulatory Visit: Payer: Self-pay | Admitting: *Deleted

## 2024-08-30 MED ORDER — TIRZEPATIDE-WEIGHT MANAGEMENT 2.5 MG/0.5ML ~~LOC~~ SOLN: 2.5000 mg | SUBCUTANEOUS | 0 refills | Status: DC

## 2024-08-30 NOTE — Telephone Encounter (Signed)
Please advse 

## 2024-08-30 NOTE — Telephone Encounter (Signed)
 I think Zepbound is going to be the most effective GLP agonist medication for him. I would recommend starting Zepbound 2.5 mg weekly for 4 weeks and then having him follow-up here before we increase the dose.  We can either send this to a local pharmacy or to [facility] direct

## 2024-09-03 ENCOUNTER — Ambulatory Visit (HOSPITAL_COMMUNITY)
Admission: RE | Admit: 2024-09-03 | Discharge: 2024-09-03 | Disposition: A | Discharge status: Home / Self Care | Source: Ambulatory Visit | Attending: Internal Medicine | Admitting: Internal Medicine

## 2024-09-03 DIAGNOSIS — R931 Abnormal findings on diagnostic imaging of heart and coronary circulation: Secondary | ICD-10-CM

## 2024-09-03 LAB — ECHOCARDIOGRAM COMPLETE
AR max vel: 1.98 cm2
AV Area VTI: 1.89 cm2
AV Area mean vel: 1.78 cm2
AV Mean grad: 8 mmHg
AV Peak grad: 14.4 mmHg
Ao pk vel: 1.9 m/s
Area-P 1/2: 7.02 cm2
Calc EF: 52.3 %
S' Lateral: 4.2 cm
Single Plane A2C EF: 45.8 %
Single Plane A4C EF: 57.1 %

## 2024-09-03 MED ORDER — PERFLUTREN LIPID MICROSPHERE
1.0000 mL | INTRAVENOUS | Status: AC | PRN
  Administered 2024-09-03: 3 mL via INTRAVENOUS

## 2024-09-06 ENCOUNTER — Encounter: Payer: Self-pay | Admitting: Gastroenterology

## 2024-09-22 ENCOUNTER — Encounter: Payer: Self-pay | Admitting: Family Medicine

## 2024-09-22 ENCOUNTER — Other Ambulatory Visit: Payer: Self-pay | Admitting: Family Medicine

## 2024-09-23 ENCOUNTER — Other Ambulatory Visit: Payer: Self-pay | Admitting: *Deleted

## 2024-09-23 MED ORDER — ZEPBOUND 2.5 MG/0.5ML ~~LOC~~ SOLN: 2.5000 mg | SUBCUTANEOUS | 0 refills | Status: AC

## 2024-09-30 ENCOUNTER — Other Ambulatory Visit: Payer: Self-pay | Admitting: Internal Medicine

## 2024-10-01 ENCOUNTER — Encounter: Payer: Self-pay | Admitting: Gastroenterology

## 2024-10-01 ENCOUNTER — Ambulatory Visit: Admitting: Gastroenterology

## 2024-10-01 ENCOUNTER — Ambulatory Visit: Payer: Self-pay | Admitting: Gastroenterology

## 2024-10-01 ENCOUNTER — Other Ambulatory Visit

## 2024-10-01 VITALS — BP 130/78 | HR 62 | Ht 66.0 in | Wt 208.2 lb

## 2024-10-01 DIAGNOSIS — Z860101 Personal history of adenomatous and serrated colon polyps: Secondary | ICD-10-CM

## 2024-10-01 DIAGNOSIS — D509 Iron deficiency anemia, unspecified: Secondary | ICD-10-CM | POA: Diagnosis not present

## 2024-10-01 DIAGNOSIS — K2970 Gastritis, unspecified, without bleeding: Secondary | ICD-10-CM

## 2024-10-01 DIAGNOSIS — Z860100 Personal history of colon polyps, unspecified: Secondary | ICD-10-CM

## 2024-10-01 LAB — HEMOGLOBIN: Hemoglobin: 13.4 g/dL (ref 13.0–17.0)

## 2024-10-01 LAB — HEMATOCRIT: HCT: 39.3 % (ref 39.0–52.0)

## 2024-10-01 MED ORDER — OMEPRAZOLE 40 MG PO CPDR: 40.0000 mg | DELAYED_RELEASE_CAPSULE | Freq: Every day | ORAL | 2 refills | Status: AC

## 2024-10-01 NOTE — Patient Instructions (Signed)
 Your provider has requested that you go to the basement level for lab work before leaving today. Press B on the elevator. The lab is located at the first door on the left as you exit the elevator.  You have been scheduled for an endoscopy. Please follow written instructions given to you at your visit today.  If you use inhalers (even only as needed), please bring them with you on the day of your procedure.  If you take any of the following medications, they will need to be adjusted prior to your procedure:   DO NOT TAKE 7 DAYS PRIOR TO TEST- Trulicity (dulaglutide) Ozempic, Wegovy  (semaglutide ) Mounjaro , Zepbound  (tirzepatide ) Bydureon Bcise (exanatide extended release)  DO NOT TAKE 1 DAY PRIOR TO YOUR TEST Rybelsus (semaglutide ) Adlyxin (lixisenatide) Victoza (liraglutide) Byetta (exanatide) ___________________________________________________________________________  Due to recent changes in healthcare laws, you may see the results of your imaging and laboratory studies on MyChart before your provider has had a chance to review them.  We understand that in some cases there may be results that are confusing or concerning to you. Not all laboratory results come back in the same time frame and the provider may be waiting for multiple results in order to interpret others.  Please give us  48 hours in order for your provider to thoroughly review all the results before contacting the office for clarification of your results.   _______________________________________________________  If your blood pressure at your visit was 140/90 or greater, please contact your primary care physician to follow up on this.  _______________________________________________________  If you are age 35 or older, your body mass index should be between 23-30. Your Body mass index is 33.61 kg/m. If this is out of the aforementioned range listed, please consider follow up with your Primary Care Provider.  If you  are age 49 or younger, your body mass index should be between 19-25. Your Body mass index is 33.61 kg/m. If this is out of the aformentioned range listed, please consider follow up with your Primary Care Provider.   ________________________________________________________  The [facility] providers would like to encourage you to use MYCHART to communicate with providers for non-urgent requests or questions.  Due to long hold times on the telephone, sending your provider a message by [facility] may be a faster and more efficient way to get a response.  Please allow 48 business hours for a response.  Please remember that this is for non-urgent requests.  _______________________________________________________  [facility] is using a team-based approach to care.  Your team is made up of your doctor and two to three APPS. Our APPS (Nurse Practitioners and Physician Assistants) work with your physician to ensure care continuity for you. They are fully qualified to address your health concerns and develop a treatment plan. They communicate directly with your gastroenterologist to care for you. Seeing the Advanced Practice Practitioners on your physician's team can help you by facilitating care more promptly, often allowing for earlier appointments, access to diagnostic testing, procedures, and other specialty referrals.   Thank you for trusting me with your gastrointestinal care. Deanna May, FNP-C

## 2024-10-01 NOTE — Progress Notes (Signed)
 "  Chief Complaint:low iron Primary GI Doctor: Dr. 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    07/27/24 seen by cardiology for elevated calcium  score. Will check an LP(a)-62.4, Echocardiogram, and Coronary CTA- Mildly dilated ascending aorta. Reviewed entire note.  Interval History Patient presents for evaluation of iron deficiency  anemia.    No previous hx of anemia. No overt bleeding. No hematemesis. No dark tarry stools.  Not vegan, consumes meat.  He does donate blood about every 4 months.  Patient on ferrous sulfate 65 mg every other day.   Patient has history taking NSAID's, stopped 5 years ago Patient is on Meloxicam  15 mg po daily and uses OTC Tylenol  prn for arthritic pain Patient on baby ASA 81mg  po daily for several years.   Patient very active, he plays pickle ball and uses stationary bike.  Patient has occasional GERD, less than twice a week. Patient denies dysphagia.  Patient denies nausea, vomiting, or weight loss. Appetite good.   Patient denies altered bowel habits, abdominal pain, or rectal bleeding.   Patient drinks 1-2 glasses wine with dinner.  Nonsmoker.   Patient on Zepbound .   Surgical history: right hip, bilateral knee replacement, carpel tunnel   Patient's family history includes: no colon CA, no esophageal CA  Wt Readings from Last 3 Encounters:  10/01/24 208 lb 4 oz (94.5 kg)  07/27/24 209 lb 1.6 oz (94.8 kg)  06/16/24 204 lb 9.6 oz (92.8 kg)    Past Medical History:  Diagnosis Date   Adenomatous colon polyp    Essential hypertension, benign    Fatty liver    Mixed hyperlipidemia    OA (osteoarthritis)    Prediabetes    Seasonal allergies     Past Surgical History:  Procedure Laterality Date   KNEE SURGERY Left     torn meniscus   ROTATOR CUFF REPAIR Right    2001   TOTAL HIP ARTHROPLASTY Right     Current Outpatient Medications  Medication Sig Dispense Refill   ALPHAGAN P 0.1 % SOLN Apply to eye.     amLODipine  (NORVASC ) 5 MG tablet Take 1 tablet (5 mg total) by mouth daily. 90 tablet 4   aspirin  EC 81 MG tablet Take 1 tablet (81 mg total) by mouth daily. Swallow whole.     EPINEPHrine  0.3 mg/0.3 mL IJ SOAJ injection SMARTSIG:1 Milligram(s) IM Daily     famotidine  (PEPCID ) 20 MG tablet Take 20 mg by mouth daily.     fexofenadine (ALLEGRA) 60 MG tablet Take 60 mg by mouth daily as needed.     ketoconazole  (NIZORAL ) 2 % shampoo Apply 1 Application topically 2 (two) times a week. 120 mL 5   meloxicam  (MOBIC ) 15 MG tablet Take 1 tablet (15 mg total) by mouth daily. 90 tablet 4   metoprolol  tartrate (LOPRESSOR ) 50 MG tablet TAKE 1 TABLET BY MOUTH TWO HOURS PRIOR TO SCHEDULED CARDIAC TEST 1 tablet 0   Multiple Vitamin (MULTIVITAMIN) tablet Take 1 tablet by mouth daily.     naltrexone (DEPADE) 50 MG tablet Take 50 mg by mouth daily.     rosuvastatin  (CRESTOR ) 10 MG tablet Take 1 tablet (10 mg total) by mouth at bedtime. 90 tablet 4   sildenafil  (VIAGRA )  100 MG tablet Take 0.5-1 tablets (50-100 mg total) by mouth daily as needed for erectile dysfunction. 30 tablet 5   tirzepatide  (ZEPBOUND ) 2.5 MG/0.5ML injection vial Inject 2.5 mg into the skin once a week. 2 mL 0   triamterene -hydrochlorothiazide (DYAZIDE) 37.5-25 MG capsule Take 1 each (1 capsule total) by mouth every morning. 90 capsule 4   No current facility-administered medications for this visit.    Allergies as of 10/01/2024 - Review Complete 10/01/2024  Allergen Reaction Noted   Penicillins  12/31/2012    Family History  Problem Relation Age of Onset   CAD Father    Ulcers Mother    Hypertension Mother    CAD Mother    Congestive Heart Failure Mother     Review of Systems:    Constitutional: No weight loss, fever, chills,  weakness or fatigue HEENT: Eyes: No change in vision               Ears, Nose, Throat:  No change in hearing or congestion Skin: No rash or itching Cardiovascular: No chest pain, chest pressure or palpitations   Respiratory: No SOB or cough Gastrointestinal: See HPI and otherwise negative Genitourinary: No dysuria or change in urinary frequency Neurological: No headache, dizziness or syncope Musculoskeletal: No new muscle or joint pain Hematologic: No bleeding or bruising Psychiatric: No history of depression or anxiety    Physical Exam:  Vital signs: BP 130/78   Pulse 62   Ht 5' 6 (1.676 m)   Wt 208 lb 4 oz (94.5 kg)   BMI 33.61 kg/m   Constitutional:   Pleasant male appears to be in NAD, Well developed, Well nourished, alert and cooperative Eyes:   PEERL, EOMI. No icterus. Conjunctiva pink. Neck:  Supple Throat: Oral cavity and pharynx without inflammation, swelling or lesion.  Respiratory: Respirations even and unlabored. Lungs clear to auscultation bilaterally.   No wheezes, crackles, or rhonchi.  Cardiovascular: Normal S1, S2. Regular rate and rhythm. No peripheral edema, cyanosis or pallor.  Gastrointestinal:  Soft, nondistended, nontender. No rebound or guarding. Normal bowel sounds. No appreciable masses or hepatomegaly. Rectal:  Not performed.  Msk:  Symmetrical without gross deformities. Without edema, no deformity or joint abnormality.  Neurologic:  Alert and  oriented x4;  grossly normal neurologically.  Skin:   Dry and intact without significant lesions or rashes.  RELEVANT LABS AND IMAGING: CBC    Latest Ref Rng & Units 07/09/2024    9:05 AM 06/16/2024    9:55 AM 12/04/2023   12:00 AM  CBC  WBC 4.0 - 10.5 K/uL 4.0  3.4  4.9      Hemoglobin 13.0 - 17.0 g/dL 89.8  9.9  87.3      Hematocrit 39.0 - 52.0 % 32.0  31.5  39      Platelets 150.0 - 400.0 K/uL 194.0  194.0  190         This result is from an external source.     CMP     Latest Ref Rng & Units  07/09/2024    9:05 AM 06/16/2024    9:55 AM 12/05/2023   12:00 AM  CMP  Glucose 70 - 99 mg/dL 891  866    BUN 6 - 23 mg/dL 20  27  23       Creatinine 0.40 - 1.50 mg/dL 8.95  8.77  1.1      Sodium 135 - 145 mEq/L 142  138  140      Potassium  3.5 - 5.1 mEq/L 4.6  4.3  4.2      Chloride 96 - 112 mEq/L 104  103  105      CO2 19 - 32 mEq/L 28  26  28       Calcium  8.4 - 10.5 mg/dL 9.1  9.2  9.4      Total Protein 6.0 - 8.3 g/dL 7.3  7.1    Total Bilirubin 0.2 - 1.2 mg/dL 0.5  0.5    Alkaline Phos 39 - 117 U/L 45  42    AST 0 - 37 U/L 33  45  27      ALT 0 - 53 U/L 32  38  24         This result is from an external source.     Lab Results  Component Value Date   TSH 0.95 06/16/2024    Lab Results  Component Value Date   IRON 20 (L) 07/09/2024   TIBC 474 (H) 07/09/2024   FERRITIN 5 (L) 07/09/2024  Vitamin B 12 600   07/2024 echo- Left ventricular ejection fraction, by estimation, is 55 to 60%.   01/2023 colonoscopy at [facility], recall 5 years  Path:   Assessment: Encounter Diagnoses  Name Primary?   Iron deficiency anemia, unspecified iron deficiency anemia type Yes   History of colonic polyps   74 year old male patient who presents with new onset iron deficiency anemia, no overt bleeding. Currently on iron supplement 1 tab po daily. Colonoscopy 01/2023  with 2 TAs otherwise normal. History of NSAID use , stopped OTC 5 years ago and currently on Meloxicam  for arthritic pain and baby ASA 81 mg po daily for heart prevention. Also donates blood every 4 mths.  Hgb 14.3 (Feb 2024), dropped to 10.1 (Nov 25). Denies fatigue, SOB, or CP. Will go ahead and recheck H/H and set him up for upper GI endoscopy to evaluate for source of bleeding. Start PPI therapy. Avoid donating blood for now.  1. Iron deficiency anemia 2. Hx of colonic polyps    Plan: Recheck H/H today Start Omeprazole  40 mg po daily  Continue iron 1 tab po every other day Do not donate blood for now Scheduled EGD in  [facility] with Dr. Suzann. The risks and benefits of EGD with possible biopsies and esophageal dilation were discussed with the patient who agrees to proceed. Hold Zepbound  1 week prior  Recall colonoscopy 01/2028   Thank you for the courtesy of this consult. Please call me with any questions or concerns.   Luisfernando Brightwell, FNP-C [facility] 10/01/2024, 9:32 AM  Cc: Kennyth Worth HERO, MD  "

## 2024-10-22 ENCOUNTER — Encounter: Admitting: Pediatrics

## 2024-12-15 ENCOUNTER — Ambulatory Visit: Admitting: Family Medicine
